# Patient Record
Sex: Female | Born: 1980 | Race: White | Hispanic: No | Marital: Married | Smoking: Current every day smoker
Health system: Southern US, Community
[De-identification: ages and names within clinical notes are randomized; demographics above are authoritative.]

## PROBLEM LIST (undated history)

## (undated) DIAGNOSIS — F17200 Nicotine dependence, unspecified, uncomplicated: Secondary | ICD-10-CM

## (undated) DIAGNOSIS — Z803 Family history of malignant neoplasm of breast: Secondary | ICD-10-CM

## (undated) DIAGNOSIS — Z8619 Personal history of other infectious and parasitic diseases: Secondary | ICD-10-CM

## (undated) DIAGNOSIS — N879 Dysplasia of cervix uteri, unspecified: Secondary | ICD-10-CM

## (undated) DIAGNOSIS — Z8669 Personal history of other diseases of the nervous system and sense organs: Secondary | ICD-10-CM

## (undated) HISTORY — DX: Nicotine dependence, unspecified, uncomplicated: F17.200

## (undated) HISTORY — DX: Family history of malignant neoplasm of breast: Z80.3

## (undated) HISTORY — DX: Personal history of other diseases of the nervous system and sense organs: Z86.69

## (undated) HISTORY — DX: Personal history of other infectious and parasitic diseases: Z86.19

## (undated) HISTORY — DX: Dysplasia of cervix uteri, unspecified: N87.9

---
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
## 2011-05-13 ENCOUNTER — Ambulatory Visit (INDEPENDENT_AMBULATORY_CARE_PROVIDER_SITE_OTHER): Payer: BC Managed Care – PPO | Admitting: Family Medicine

## 2011-05-13 ENCOUNTER — Encounter: Payer: Self-pay | Admitting: Family Medicine

## 2011-05-13 VITALS — BP 118/80 | HR 88 | Temp 98.0°F | Ht 65.0 in | Wt 133.8 lb

## 2011-05-13 DIAGNOSIS — Z0000 Encounter for general adult medical examination without abnormal findings: Secondary | ICD-10-CM

## 2011-05-13 DIAGNOSIS — R61 Generalized hyperhidrosis: Secondary | ICD-10-CM

## 2011-05-13 DIAGNOSIS — Z23 Encounter for immunization: Secondary | ICD-10-CM

## 2011-05-13 DIAGNOSIS — F17200 Nicotine dependence, unspecified, uncomplicated: Secondary | ICD-10-CM

## 2011-05-13 LAB — LIPID PANEL
LDL Cholesterol: 59 mg/dL (ref 0–99)
Total CHOL/HDL Ratio: 2

## 2011-05-13 LAB — CBC WITH DIFFERENTIAL/PLATELET
Basophils Relative: 0.6 % (ref 0.0–3.0)
Eosinophils Absolute: 0.1 10*3/uL (ref 0.0–0.7)
MCHC: 34 g/dL (ref 30.0–36.0)
MCV: 97.1 fl (ref 78.0–100.0)
Monocytes Absolute: 0.7 10*3/uL (ref 0.1–1.0)
Neutrophils Relative %: 63.7 % (ref 43.0–77.0)
RBC: 4.54 Mil/uL (ref 3.87–5.11)

## 2011-05-13 LAB — COMPREHENSIVE METABOLIC PANEL
ALT: 20 U/L (ref 0–35)
AST: 19 U/L (ref 0–37)
Albumin: 4 g/dL (ref 3.5–5.2)
Calcium: 8.9 mg/dL (ref 8.4–10.5)
Chloride: 107 mEq/L (ref 96–112)
Creatinine, Ser: 1 mg/dL (ref 0.4–1.2)
Potassium: 4.2 mEq/L (ref 3.5–5.1)
Sodium: 140 mEq/L (ref 135–145)

## 2011-05-13 NOTE — Patient Instructions (Signed)
Blood work today. Tdap today (tetanus) Look into [email or web address] for smoking cessation resources, return if you would like to further discuss. Remember, more fruits/vegetables daily. Call us with questions.  Good to meet you today. Return as needed or in another 2-3 years for physical.

## 2011-05-13 NOTE — Assessment & Plan Note (Signed)
Reviewed preventative protocols and updated unless pt declined. Flu declined. Tdap today. Discussed 100% seatbelt use, sunscreen use. Blood work today, fasting.

## 2011-05-13 NOTE — Assessment & Plan Note (Addendum)
blood work today (TSH, CBC). Consider PPD, HIV. Return if weight changes or sxs continued, worsening. overall healthy today.

## 2011-05-13 NOTE — Assessment & Plan Note (Signed)
Encouraged cessation, provided with resources.

## 2011-05-13 NOTE — Progress Notes (Signed)
Subjective:    Patient ID: Amber Donaldson, female    DOB: Nov 06, 1980, 30 y.o.   MRN: [id number]  HPI CC: new pt  No concerns today.  would like CPE today.  Smoking - <1/2 ppd, smoking since age 33yo.  Has quit x 1 while pregnant.  Has not tried anything to help quit in past.  Contemplative.  Some night sweats occuring a few times a week, has to change shirt.  No fevers, no weight changes.  Last 7 mo, hair growing more slowly.  No skin changes.  No bm changes, diarrhea, constipation.  Some heat intolerance.  Some fatigue.  Sleeping 7 hours/night.  Preventative: [facility] - [facility] in [place], last 10/2010.  h/o abnl paps, none since 30yo.  LMP 04/26/2011. No recent blood work Tetanus - unsure.  Tdap today. Flu shot - declines today. Fasting today. Seat belt 100% time, sunscreen use.  Caffeine: 1-2 sodas/day Lives with husband, 1 daughter (2010), no pets Occupation: stay at home mom Edu: Some college Activity: home exercise regimen 15 min/day Diet: some fruits/vegetables, good water, red meat QOweek, rare fish  Medications and allergies reviewed and updated in chart.  Past histories reviewed and updated if relevant as below. Patient Active Problem List  Diagnoses  . Smoker  . Healthcare maintenance   Past Medical History  Diagnosis Date  . History of chicken pox   . History of migraines     1/month, excedrin controls   Past Surgical History  Procedure Date  . Induced abortion 2001   History  Substance Use Topics  . Smoking status: Current Everyday Smoker -- 0.3 packs/day    Types: Cigarettes  . Smokeless tobacco: Never Used  . Alcohol Use: Yes     occasionally   Family History  Problem Relation Age of Onset  . Cancer Mother 65  . Heart disease Paternal Uncle   . Heart disease Paternal Grandfather   . Stroke Neg Hx   . Diabetes Cousin    No Known Allergies No current outpatient prescriptions on file prior to visit.    Review of Systems    Constitutional: Negative for fever, chills, activity change, appetite change, fatigue and unexpected weight change.  HENT: Negative for hearing loss and neck pain.   Eyes: Negative for visual disturbance.  Respiratory: Negative for cough, chest tightness, shortness of breath and wheezing.   Cardiovascular: Negative for chest pain, palpitations and leg swelling.  Gastrointestinal: Negative for nausea, vomiting, abdominal pain, diarrhea, constipation, blood in stool and abdominal distention.  Genitourinary: Negative for hematuria and difficulty urinating.  Musculoskeletal: Negative for myalgias and arthralgias.  Skin: Negative for rash.  Neurological: Positive for headaches. Negative for dizziness, seizures and syncope.  Hematological: Does not bruise/bleed easily.  Psychiatric/Behavioral: Negative for dysphoric mood. The patient is not nervous/anxious.   occasional night sweats    Objective:   Physical Exam  Nursing note and vitals reviewed. Constitutional: She is oriented to person, place, and time. She appears well-developed and well-nourished. No distress.  HENT:  Head: Normocephalic and atraumatic.  Right Ear: External ear normal.  Left Ear: External ear normal.  Nose: Nose normal.  Mouth/Throat: Oropharynx is clear and moist. No oropharyngeal exudate.  Eyes: Conjunctivae and EOM are normal. Pupils are equal, round, and reactive to light. No scleral icterus.  Neck: Normal range of motion. Neck supple. No thyromegaly present.  Cardiovascular: Normal rate, regular rhythm, normal heart sounds and intact distal pulses.   No murmur heard. Pulses:  Radial pulses are 2+ on the right side, and 2+ on the left side.  Pulmonary/Chest: Effort normal and breath sounds normal. No respiratory distress. She has no wheezes. She has no rales.  Abdominal: Soft. Bowel sounds are normal. She exhibits no distension and no mass. There is no tenderness. There is no rebound and no guarding.   Musculoskeletal: Normal range of motion.  Lymphadenopathy:    She has no cervical adenopathy.  Neurological: She is alert and oriented to person, place, and time.       CN grossly intact, station and gait intact  Skin: Skin is warm and dry. No rash noted.  Psychiatric: She has a normal mood and affect. Her behavior is normal. Judgment and thought content normal.        Assessment & Plan:

## 2014-06-09 NOTE — L&D Delivery Note (Signed)
Delivery Note At 7:36 PM a viable female was delivered via Vaginal, Spontaneous Delivery (Presentation: ; Occiput Anterior).  APGAR: 9, 9; weight 6#12oz .   Placenta status: Intact, Spontaneous.  Cord: 3 vessels with the following complications: double true knot, nuchal x 1, reduced easily.  Cord pH: N/A  Anesthesia: Epidural  Episiotomy: None Lacerations: 1st degree Suture Repair: 2.0 vicryl Est. Blood Loss (mL): 400  Approx 45 min second stage. Infant to maternal abdomen, cord clamped and cut by FOB. Cord blood specimen collected for donation. Infant to skin-to-skin with mother.   Baby's Name: Amber Donaldson to postpartum.  Baby to Couplet care / Skin to Skin.  Marta Antu 01/13/2015, 8:13 PM

## 2014-07-31 ENCOUNTER — Encounter: Payer: Self-pay | Admitting: Obstetrics & Gynecology

## 2014-11-02 LAB — OB RESULTS CONSOLE GC/CHLAMYDIA
Chlamydia: NEGATIVE
Gonorrhea: NEGATIVE

## 2014-11-02 LAB — OB RESULTS CONSOLE ANTIBODY SCREEN: ANTIBODY SCREEN: NEGATIVE

## 2014-11-02 LAB — OB RESULTS CONSOLE HEPATITIS B SURFACE ANTIGEN: HEP B S AG: NEGATIVE

## 2014-11-02 LAB — OB RESULTS CONSOLE ABO/RH: RH Type: POSITIVE

## 2014-11-02 LAB — OB RESULTS CONSOLE RPR: RPR: NONREACTIVE

## 2014-11-02 LAB — OB RESULTS CONSOLE HIV ANTIBODY (ROUTINE TESTING): HIV: NONREACTIVE

## 2014-11-02 LAB — OB RESULTS CONSOLE RUBELLA ANTIBODY, IGM: RUBELLA: IMMUNE

## 2014-11-02 LAB — OB RESULTS CONSOLE VARICELLA ZOSTER ANTIBODY, IGG: VARICELLA IGG: IMMUNE

## 2015-01-03 LAB — OB RESULTS CONSOLE GBS: GBS: NEGATIVE

## 2015-01-13 ENCOUNTER — Inpatient Hospital Stay: Payer: BLUE CROSS/BLUE SHIELD | Admitting: Anesthesiology

## 2015-01-13 ENCOUNTER — Inpatient Hospital Stay
Admission: EM | Admit: 2015-01-13 | Discharge: 2015-01-14 | DRG: 775 | Disposition: A | Discharge status: Home / Self Care | Payer: BLUE CROSS/BLUE SHIELD | Attending: Obstetrics & Gynecology | Admitting: Obstetrics & Gynecology

## 2015-01-13 DIAGNOSIS — L723 Sebaceous cyst: Secondary | ICD-10-CM | POA: Diagnosis present

## 2015-01-13 DIAGNOSIS — Z3A38 38 weeks gestation of pregnancy: Secondary | ICD-10-CM | POA: Diagnosis present

## 2015-01-13 DIAGNOSIS — Z833 Family history of diabetes mellitus: Secondary | ICD-10-CM | POA: Diagnosis not present

## 2015-01-13 DIAGNOSIS — Z87891 Personal history of nicotine dependence: Secondary | ICD-10-CM

## 2015-01-13 DIAGNOSIS — O4290 Premature rupture of membranes, unspecified as to length of time between rupture and onset of labor, unspecified weeks of gestation: Secondary | ICD-10-CM | POA: Diagnosis present

## 2015-01-13 DIAGNOSIS — Z809 Family history of malignant neoplasm, unspecified: Secondary | ICD-10-CM | POA: Diagnosis not present

## 2015-01-13 DIAGNOSIS — Z8249 Family history of ischemic heart disease and other diseases of the circulatory system: Secondary | ICD-10-CM | POA: Diagnosis not present

## 2015-01-13 LAB — CBC
HCT: 39.6 % (ref 35.0–47.0)
Hemoglobin: 13.7 g/dL (ref 12.0–16.0)
MCH: 32.2 pg (ref 26.0–34.0)
MCHC: 34.6 g/dL (ref 32.0–36.0)
MCV: 93.1 fL (ref 80.0–100.0)
Platelets: 184 10*3/uL (ref 150–440)
RBC: 4.26 MIL/uL (ref 3.80–5.20)
RDW: 12.5 % (ref 11.5–14.5)
WBC: 8 10*3/uL (ref 3.6–11.0)

## 2015-01-13 LAB — SAMPLE TO BLOOD BANK

## 2015-01-13 MED ORDER — ACETAMINOPHEN 325 MG PO TABS: 650.0000 mg | ORAL_TABLET | ORAL | Status: DC | PRN

## 2015-01-13 MED ORDER — OXYCODONE-ACETAMINOPHEN 5-325 MG PO TABS: 1.0000 | ORAL_TABLET | ORAL | Status: DC | PRN

## 2015-01-13 MED ORDER — LIDOCAINE-EPINEPHRINE (PF) 1.5 %-1:200000 IJ SOLN
INTRAMUSCULAR | Status: DC | PRN
  Administered 2015-01-13: 4 mL via EPIDURAL

## 2015-01-13 MED ORDER — EPHEDRINE 5 MG/ML INJ
10.0000 mg | INTRAVENOUS | Status: DC | PRN
  Filled 2015-01-13: qty 2

## 2015-01-13 MED ORDER — ONDANSETRON HCL 4 MG PO TABS: 4.0000 mg | ORAL_TABLET | ORAL | Status: DC | PRN

## 2015-01-13 MED ORDER — AMMONIA AROMATIC IN INHA
RESPIRATORY_TRACT | Status: DC
Start: 2015-01-13 — End: 2015-01-14
  Filled 2015-01-13: qty 10

## 2015-01-13 MED ORDER — DIBUCAINE 1 % RE OINT: 1.0000 "application " | TOPICAL_OINTMENT | RECTAL | Status: DC | PRN

## 2015-01-13 MED ORDER — ZOLPIDEM TARTRATE 5 MG PO TABS: 5.0000 mg | ORAL_TABLET | Freq: Every evening | ORAL | Status: DC | PRN

## 2015-01-13 MED ORDER — OXYTOCIN 40 UNITS IN LACTATED RINGERS INFUSION - SIMPLE MED
INTRAVENOUS | Status: AC
Start: 2015-01-13 — End: 2015-01-13
  Filled 2015-01-13: qty 1000

## 2015-01-13 MED ORDER — FERROUS SULFATE 325 (65 FE) MG PO TABS
325.0000 mg | ORAL_TABLET | Freq: Every day | ORAL | Status: DC
  Administered 2015-01-14: 325 mg via ORAL
  Filled 2015-01-13: qty 1

## 2015-01-13 MED ORDER — FENTANYL 2.5 MCG/ML W/ROPIVACAINE 0.2% IN NS 100 ML EPIDURAL INFUSION (ARMC-ANES)
9.0000 mL/h | EPIDURAL | Status: DC
  Administered 2015-01-13: 9 mL/h via EPIDURAL

## 2015-01-13 MED ORDER — IBUPROFEN 600 MG PO TABS
600.0000 mg | ORAL_TABLET | Freq: Four times a day (QID) | ORAL | Status: DC
  Administered 2015-01-13 – 2015-01-14 (×2): 600 mg via ORAL
  Filled 2015-01-13 (×2): qty 1

## 2015-01-13 MED ORDER — BENZOCAINE-MENTHOL 20-0.5 % EX AERO
1.0000 | INHALATION_SPRAY | CUTANEOUS | Status: DC | PRN
Start: 2015-01-13 — End: 2015-01-15

## 2015-01-13 MED ORDER — BUTORPHANOL TARTRATE 1 MG/ML IJ SOLN
2.0000 mg | Freq: Once | INTRAMUSCULAR | Status: AC | PRN
  Administered 2015-01-13: 2 mg via INTRAVENOUS
  Filled 2015-01-13: qty 2

## 2015-01-13 MED ORDER — LIDOCAINE HCL (PF) 1 % IJ SOLN
INTRAMUSCULAR | Status: AC
  Administered 2015-01-13: 2 mL
  Administered 2015-01-13: 1 mL
  Filled 2015-01-13: qty 30

## 2015-01-13 MED ORDER — WITCH HAZEL-GLYCERIN EX PADS: 1.0000 "application " | MEDICATED_PAD | CUTANEOUS | Status: DC | PRN

## 2015-01-13 MED ORDER — LACTATED RINGERS IV SOLN: INTRAVENOUS | Status: DC

## 2015-01-13 MED ORDER — SIMETHICONE 80 MG PO CHEW: 80.0000 mg | CHEWABLE_TABLET | ORAL | Status: DC | PRN

## 2015-01-13 MED ORDER — LANOLIN HYDROUS EX OINT: TOPICAL_OINTMENT | CUTANEOUS | Status: DC | PRN

## 2015-01-13 MED ORDER — PRENATAL MULTIVITAMIN CH
1.0000 | ORAL_TABLET | Freq: Every day | ORAL | Status: DC
  Administered 2015-01-14: 1 via ORAL
  Filled 2015-01-13: qty 1

## 2015-01-13 MED ORDER — DOCUSATE SODIUM 100 MG PO CAPS
100.0000 mg | ORAL_CAPSULE | Freq: Two times a day (BID) | ORAL | Status: DC
  Administered 2015-01-14: 100 mg via ORAL
  Filled 2015-01-13: qty 1

## 2015-01-13 MED ORDER — OXYTOCIN BOLUS FROM INFUSION: 500.0000 mL | INTRAVENOUS | Status: DC

## 2015-01-13 MED ORDER — DIPHENHYDRAMINE HCL 25 MG PO CAPS: 25.0000 mg | ORAL_CAPSULE | Freq: Four times a day (QID) | ORAL | Status: DC | PRN

## 2015-01-13 MED ORDER — ONDANSETRON HCL 4 MG/2ML IJ SOLN: 4.0000 mg | INTRAMUSCULAR | Status: DC | PRN

## 2015-01-13 MED ORDER — OXYTOCIN 10 UNIT/ML IJ SOLN
INTRAMUSCULAR | Status: AC
  Filled 2015-01-13: qty 2

## 2015-01-13 MED ORDER — PHENYLEPHRINE 40 MCG/ML (10ML) SYRINGE FOR IV PUSH (FOR BLOOD PRESSURE SUPPORT)
80.0000 ug | PREFILLED_SYRINGE | INTRAVENOUS | Status: DC | PRN
  Filled 2015-01-13: qty 2

## 2015-01-13 MED ORDER — FENTANYL 2.5 MCG/ML W/ROPIVACAINE 0.2% IN NS 100 ML EPIDURAL INFUSION (ARMC-ANES)
EPIDURAL | Status: AC
  Administered 2015-01-13: 9 mL/h via EPIDURAL
  Filled 2015-01-13: qty 100

## 2015-01-13 MED ORDER — OXYTOCIN 40 UNITS IN LACTATED RINGERS INFUSION - SIMPLE MED
62.5000 mL/h | INTRAVENOUS | Status: DC
  Administered 2015-01-13: 62.5 mL/h via INTRAVENOUS

## 2015-01-13 MED ORDER — BUPIVACAINE HCL (PF) 0.25 % IJ SOLN
INTRAMUSCULAR | Status: DC | PRN
  Administered 2015-01-13: 5 mL

## 2015-01-13 MED ORDER — DIPHENHYDRAMINE HCL 50 MG/ML IJ SOLN: 12.5000 mg | INTRAMUSCULAR | Status: DC | PRN

## 2015-01-13 MED ORDER — LACTATED RINGERS IV SOLN
INTRAVENOUS | Status: DC
  Administered 2015-01-13 (×2): via INTRAVENOUS

## 2015-01-13 MED ORDER — LACTATED RINGERS IV SOLN: 500.0000 mL | INTRAVENOUS | Status: DC | PRN

## 2015-01-13 MED ORDER — FENTANYL 2.5 MCG/ML W/ROPIVACAINE 0.2% IN NS 100 ML EPIDURAL INFUSION (ARMC-ANES): 9.0000 mL/h | EPIDURAL | Status: DC

## 2015-01-13 MED ORDER — MISOPROSTOL 200 MCG PO TABS
ORAL_TABLET | ORAL | Status: DC
Start: 2015-01-13 — End: 2015-01-14
  Filled 2015-01-13: qty 4

## 2015-01-13 NOTE — Anesthesia Procedure Notes (Signed)
Epidural Patient location during procedure: OB  Staffing Performed by: anesthesiologist   Preanesthetic Checklist Completed: patient identified, site marked, surgical consent, pre-op evaluation, timeout performed, IV checked, risks and benefits discussed and monitors and equipment checked  Epidural Patient position: sitting Prep: Betadine Patient monitoring: heart rate, continuous pulse ox and blood pressure Approach: midline Location: L4-L5 Injection technique: LOR saline  Needle:  Needle type: Tuohy  Needle gauge: 18 G Needle length: 9 cm and 9 Needle insertion depth: 5 cm Catheter type: closed end flexible Catheter size: 20 Guage Catheter at skin depth: 10 cm Test dose: negative and 1.5% lidocaine with Epi 1:200 K  Assessment Sensory level: T10 Events: blood not aspirated, injection not painful, no injection resistance, negative IV test and no paresthesia  Additional Notes   Patient tolerated the insertion well without complications.-SATD -IVTD. No paresthesia. Refer to [facility] nursing for VS and dosing. Initial placemnt at L4L5 but significant scoliosis with transient Left paresthesia resolving spont. Changed to L3L4 and easy pass of catheter 5 cm into space, uneventful and well tolerated.Reason for block:procedure for pain

## 2015-01-13 NOTE — Anesthesia Preprocedure Evaluation (Signed)
Anesthesia Evaluation  Patient identified by MRN, date of birth, ID band Patient awake    Reviewed: Allergy & Precautions, H&P , NPO status , Patient's Chart, lab work & pertinent test results, reviewed documented beta blocker date and time   Airway Mallampati: III  TM Distance: >3 FB Neck ROM: full    Dental no notable dental hx. (+) Teeth Intact   Pulmonary neg pulmonary ROS, former smoker,  breath sounds clear to auscultation  Pulmonary exam normal       Cardiovascular Exercise Tolerance: Good negative cardio ROS Normal cardiovascular examRhythm:regular Rate:Normal     Neuro/Psych negative neurological ROS  negative psych ROS   GI/Hepatic negative GI ROS, Neg liver ROS,   Endo/Other  negative endocrine ROS  Renal/GU negative Renal ROS  negative genitourinary   Musculoskeletal   Abdominal   Peds  Hematology negative hematology ROS (+)   Anesthesia Other Findings   Reproductive/Obstetrics (+) Pregnancy                             Anesthesia Physical Anesthesia Plan  ASA: II and emergent  Anesthesia Plan: Regional and Epidural   Post-op Pain Management:    Induction:   Airway Management Planned:   Additional Equipment:   Intra-op Plan:   Post-operative Plan:   Informed Consent: I have reviewed the patients History and Physical, chart, labs and discussed the procedure including the risks, benefits and alternatives for the proposed anesthesia with the patient or authorized representative who has indicated his/her understanding and acceptance.     Plan Discussed with: CRNA  Anesthesia Plan Comments:         Anesthesia Quick Evaluation

## 2015-01-13 NOTE — H&P (Signed)
Obstetric History and Physical  Amber Donaldson is a 34 y.o. G3P1011 with Estimated Date of Delivery: 01/24/15 per LMP and 10 wk [place] who presents at [redacted]w[redacted]d  presenting for LOF at 0700 this am. Pt reports a "constant dripping" followed by a large gush of fluid. Patient states she has been having occasional, mild contractions, no vaginal bleeding, with active fetal movement.    Prenatal Course Source of Care: [facility]  with onset of care at 8 weeks Pregnancy complications or risks: 1st trimester with elevated DS risk, negative Panorama. Otherwise normal pregnancy  She plans to breastfeed She desires oral progesterone-only contraceptive for postpartum contraception.   Prenatal labs and studies: ABO, Rh: A+  Antibody: negative Rubella: Immune Varicella: Immune RPR:  Non-reactive HBsAg:  neg HIV: neg GC/CT: neg/neg GBS: neg 1 hr Glucola: 76   Genetic screening: negative Panorama after 1st trimester screen 1:100 DS risk    Prenatal Transfer Tool   Past Medical History  Diagnosis Date  . History of chicken pox   . History of migraines     1/month, excedrin controls  . Smoker     Past Surgical History  Procedure Laterality Date  . Induced abortion  2001  . No past surgeries      OB History  Gravida Para Term Preterm AB SAB TAB Ectopic Multiple Living  3 1 1  1 1    1     # Outcome Date GA Lbr Len/2nd Weight Sex Delivery Anes PTL Lv  3 Current           2 SAB 11/20/09          1 Term 07/06/08 [redacted]w[redacted]d  7 lb 6 oz (3.345 kg) F Vag-Spont EPI N Y      History   Social History  . Marital Status: Married    Spouse Name: N/A  . Number of Children: N/A  . Years of Education: N/A   Social History Main Topics  . Smoking status: Former Smoker -- 0.30 packs/day    Types: Cigarettes  . Smokeless tobacco: Never Used  . Alcohol Use: No     Comment: occasionally  . Drug Use: No  . Sexual Activity: Yes   Other Topics Concern  . None   Social History Narrative   Caffeine: 1-2  sodas/day   Lives with husband, 1 daughter (2010), no pets   Occupation: stay at home mom   Edu: Some college   Activity: home exercise regimen 15 min/day   Diet: some fruits/vegetables, good water, red meat QOweek, rare fish    Family History  Problem Relation Age of Onset  . Cancer Mother 11  . Heart disease Paternal Uncle   . Heart disease Paternal Grandfather   . Stroke Neg Hx   . Diabetes Cousin     Prescriptions prior to admission  Medication Sig Dispense Refill Last Dose  . Prenatal Vit-Fe Fumarate-FA (PRENATAL MULTIVITAMIN) TABS tablet Take 1 tablet by mouth daily at 12 noon.     . cyanocobalamin 500 MCG tablet Take 500 mcg by mouth daily.     Not Taking at Unknown time  . pyridoxine (B-6) 100 MG tablet Take 100 mg by mouth daily.     Not Taking at Unknown time    No Known Allergies  Review of Systems: Negative except for what is mentioned in HPI.  Physical Exam: BP 120/74 mmHg  Pulse 64  Temp(Src) 97.9 F (36.6 C) (Oral)  Resp 16  Ht 5\' 5"  (1.651 m)  Wt 183 lb (83.008 kg)  BMI 30.45 kg/m2  LMP 04/19/2014 GENERAL: Well-developed, well-nourished female in no acute distress.  LUNGS: Clear to auscultation bilaterally.  HEART: Regular rate and rhythm. ABDOMEN: Soft, nontender, nondistended, gravid. EXTREMITIES: Nontender, no edema Cervical Exam: Dilatation 3 cm   Effacement 70 %   Station -1, SSE: + pooling, + nitrizine, no ferning noted Presentation: cephalic FHT: Category: 1 Baseline rate 140 bpm   Variability moderate  Accelerations present   Decelerations none Contractions: Every rare mins   Pertinent Labs/Studies:   No results found for this or any previous visit (from the past 24 hour(s)).  Assessment : IUP at [redacted]w[redacted]d, SROM  Plan: Admit for SROM/labor  Monitor for cervical change and contractions - ambulation for now, consider Pitocin augmentation prn. IV stadol or epidural when indicated.   Planning cord blood banking to public bank - will fill out  paper work and collect specimens indicated. Reviewed will not likely be able to do delayed cord clamping.

## 2015-01-13 NOTE — Progress Notes (Signed)
Pt transferred to [facility] room 340. [employer] accompanying pt to room. [employer] notified of pick up, as directed by [employer]. Courier representative Shawn also aware of pt transfer to room 340, [employer] contact number provided and states the company will pick up [employer] box Mon morning between 7a-8a. Pt and spouse aware.

## 2015-01-13 NOTE — Progress Notes (Signed)
L&D Note  01/13/2015 - 12:59 PM  34 y.o. G3P1011 [redacted]w[redacted]d   Amber Donaldson is admitted for SROM   Subjective:  Pt has been ambulated intermittently for the past few hours,  Reports ctx are more intense now Objective:   Filed Vitals:   01/13/15 0859 01/13/15 0900 01/13/15 0914 01/13/15 1130  BP: 129/91 126/93 120/74   Pulse: 70 60 64   Temp:    98.1 F (36.7 C)  TempSrc:    Oral  Resp:      Height:      Weight:        Current Vital Signs 24h Vital Sign Ranges  T 98.1 F (36.7 C) Temp  Avg: 98 F (36.7 C)  Min: 97.9 F (36.6 C)  Max: 98.1 F (36.7 C)  BP 120/74 mmHg BP  Min: 120/74  Max: 133/82  HR 64 Pulse  Avg: 63  Min: 58  Max: 70  RR 16 Resp  Avg: 16  Min: 16  Max: 16  SaO2   Not Delivered No Data Recorded       24 Hour I/O Current Shift I/O  Time Ins Outs        FHR: category 1 Toco: just reapplied, approx q 3 min SVE: 4/80/-2   Assessment :  IUP at [redacted]w[redacted]d, SROM, early labor    Plan:  Continue expectant management for now IV stadol or epidural if desired   Marta Antu, CNM

## 2015-01-14 LAB — HEMOGLOBIN AND HEMATOCRIT, BLOOD
HEMATOCRIT: 39 % (ref 35.0–47.0)
HEMOGLOBIN: 13.6 g/dL (ref 12.0–16.0)

## 2015-01-14 LAB — RPR: RPR Ser Ql: NONREACTIVE

## 2015-01-14 MED ORDER — IBUPROFEN 600 MG PO TABS
600.0000 mg | ORAL_TABLET | Freq: Four times a day (QID) | ORAL | Status: DC
Start: 2015-01-14 — End: 2017-07-20

## 2015-01-14 MED ORDER — WHITE PETROLATUM GEL
Status: AC
  Filled 2015-01-14: qty 10

## 2015-01-14 MED ORDER — NORETHIN ACE-ETH ESTRAD-FE 1-20 MG-MCG PO TABS: 1.0000 | ORAL_TABLET | Freq: Every day | ORAL | Status: DC

## 2015-01-14 NOTE — Progress Notes (Signed)
Discharge instructions reviewed with pt. Pt discharged home with newborn.

## 2015-01-14 NOTE — Discharge Instructions (Signed)
Discharge instructions:  ° °Call office if you have any of the following: headache, visual changes, fever >100 F, chills, breast concerns, excessive vaginal bleeding, incision drainage or problems, leg pain or redness, depression or any other concerns.  ° °Activity: Do not lift > 10 lbs for 6 weeks.  °No intercourse or tampons for 6 weeks.  °No driving for 1-2 weeks.  ° °

## 2015-01-14 NOTE — Discharge Summary (Signed)
Obstetric Discharge Summary Reason for Admission: onset of labor Delivery Type: spontaneous vaginal delivery Postpartum Procedures: none Complications-Intrapartum or Postpartum: none    Recent Labs  01/13/15 1134 01/14/15 0529  HGB 13.7 13.6  HCT 39.6 39.0      Gestational Age at Delivery: [redacted]w[redacted]d  Antepartum complications: none Date of Delivery: 01/13/15  Delivered By: Donnalee Curry, CNM Delivery Type: spontaneous vaginal delivery  Physical Exam:  General: alert and cooperative Lochia: appropriate Uterine Fundus: firm Incision: N/A DVT Evaluation: No evidence of DVT seen on physical exam. Abdomen: abdomen is soft without significant tenderness, masses, organomegaly or guarding  Prenatal Labs Blood Type: a+ Rubella: Immune Varicella: Immune TDAP: Given during pregnancy Contraception: OCP  Discharge Diagnoses: Term Pregnancy-delivered  Discharge Information: Date: 01/14/2015 Activity: pelvic rest Diet: routine Medications: PNV, Ibuprofen and Junel Fe Condition: stable Instructions:  Discharge instructions:   Call office if you have any of the following: headache, visual changes, fever >100 F, chills, breast concerns, excessive vaginal bleeding, incision drainage or problems, leg pain or redness, depression or any other concerns.   Activity: Do not lift > 10 lbs for 6 weeks.  No intercourse or tampons for 6 weeks.  No driving for 1-2 weeks.   Discharge to: home Follow-up Information    Follow up with Marta Antu, CNM. Schedule an appointment as soon as possible for a visit in 6 weeks.   Specialty:  Certified Nurse Midwife   Why:  Postpartum visit   Contact information:   [address] [place] [address] [phone]       Newborn Data: Live born female  Birth Weight: 6 lb 12.8 oz (3085 g) APGAR: 9, 9  Home with mother.  Marta Antu, [place] 01/14/2015, 10:08 AM

## 2015-01-14 NOTE — Anesthesia Postprocedure Evaluation (Signed)
  Anesthesia Post-op Note  Patient: Amber Donaldson  Procedure(s) Performed: * No procedures listed *  Anesthesia type:Regional, Epidural  Patient location: PACU  Post pain: Pain level controlled  Post assessment: Post-op Vital signs reviewed, Patient's Cardiovascular Status Stable, Respiratory Function Stable, Patent Airway and No signs of Nausea or vomiting  Post vital signs: Reviewed and stable  Last Vitals:  Filed Vitals:   01/14/15 0732  BP: 105/74  Pulse: 66  Temp: 36.6 C  Resp: 20    Level of consciousness: awake, alert  and patient cooperative  Complications: No apparent anesthesia complications

## 2015-01-16 LAB — SURGICAL PATHOLOGY

## 2017-07-19 ENCOUNTER — Encounter
Admission: EM | Disposition: A | Discharge status: Home / Self Care | Payer: Self-pay | Source: Home / Self Care | Attending: Emergency Medicine

## 2017-07-19 ENCOUNTER — Emergency Department: Payer: 59

## 2017-07-19 ENCOUNTER — Encounter: Payer: Self-pay | Admitting: Emergency Medicine

## 2017-07-19 ENCOUNTER — Emergency Department: Payer: 59 | Admitting: Anesthesiology

## 2017-07-19 ENCOUNTER — Observation Stay
Admission: EM | Admit: 2017-07-19 | Discharge: 2017-07-20 | Disposition: A | Discharge status: Home / Self Care | Payer: 59 | Attending: Surgery | Admitting: Surgery

## 2017-07-19 ENCOUNTER — Other Ambulatory Visit: Payer: Self-pay

## 2017-07-19 DIAGNOSIS — M546 Pain in thoracic spine: Secondary | ICD-10-CM

## 2017-07-19 DIAGNOSIS — K810 Acute cholecystitis: Secondary | ICD-10-CM

## 2017-07-19 DIAGNOSIS — F17210 Nicotine dependence, cigarettes, uncomplicated: Secondary | ICD-10-CM | POA: Diagnosis not present

## 2017-07-19 DIAGNOSIS — K8012 Calculus of gallbladder with acute and chronic cholecystitis without obstruction: Principal | ICD-10-CM | POA: Insufficient documentation

## 2017-07-19 DIAGNOSIS — R079 Chest pain, unspecified: Secondary | ICD-10-CM | POA: Diagnosis present

## 2017-07-19 DIAGNOSIS — K8000 Calculus of gallbladder with acute cholecystitis without obstruction: Secondary | ICD-10-CM | POA: Diagnosis not present

## 2017-07-19 DIAGNOSIS — K819 Cholecystitis, unspecified: Secondary | ICD-10-CM

## 2017-07-19 HISTORY — PX: CHOLECYSTECTOMY: SHX55

## 2017-07-19 LAB — CBC
HCT: 46.1 % (ref 35.0–47.0)
Hemoglobin: 15.5 g/dL (ref 12.0–16.0)
MCH: 31.6 pg (ref 26.0–34.0)
MCHC: 33.8 g/dL (ref 32.0–36.0)
MCV: 93.5 fL (ref 80.0–100.0)
Platelets: 246 10*3/uL (ref 150–440)
RBC: 4.92 MIL/uL (ref 3.80–5.20)
RDW: 13.1 % (ref 11.5–14.5)
WBC: 7.9 10*3/uL (ref 3.6–11.0)

## 2017-07-19 LAB — BASIC METABOLIC PANEL
Anion gap: 10 (ref 5–15)
BUN: 17 mg/dL (ref 6–20)
CHLORIDE: 104 mmol/L (ref 101–111)
CO2: 24 mmol/L (ref 22–32)
Calcium: 9.5 mg/dL (ref 8.9–10.3)
Creatinine, Ser: 1.09 mg/dL — ABNORMAL HIGH (ref 0.44–1.00)
GFR calc non Af Amer: >60 mL/min (ref >60)
GLUCOSE: 100 mg/dL — AB (ref 65–99)
Potassium: 3.7 mmol/L (ref 3.5–5.1)
Sodium: 138 mmol/L (ref 135–145)

## 2017-07-19 LAB — HEPATIC FUNCTION PANEL
ALT: 25 U/L (ref 14–54)
AST: 16 U/L (ref 15–41)
Albumin: 3.8 g/dL (ref 3.5–5.0)
Alkaline Phosphatase: 34 U/L — ABNORMAL LOW (ref 38–126)
BILIRUBIN TOTAL: 1.4 mg/dL — AB (ref 0.3–1.2)
Bilirubin, Direct: 0.2 mg/dL (ref 0.1–0.5)
Indirect Bilirubin: 1.2 mg/dL — ABNORMAL HIGH (ref 0.3–0.9)
TOTAL PROTEIN: 6.6 g/dL (ref 6.5–8.1)

## 2017-07-19 LAB — TROPONIN I
Troponin I: <0.03 ng/mL (ref <0.03)
Troponin I: <0.03 ng/mL (ref <0.03)

## 2017-07-19 LAB — LIPASE, BLOOD: Lipase: 29 U/L (ref 11–51)

## 2017-07-19 SURGERY — LAPAROSCOPIC CHOLECYSTECTOMY WITH INTRAOPERATIVE CHOLANGIOGRAM
Anesthesia: General

## 2017-07-19 MED ORDER — ACETAMINOPHEN 10 MG/ML IV SOLN
INTRAVENOUS | Status: AC
  Filled 2017-07-19: qty 100

## 2017-07-19 MED ORDER — ONDANSETRON HCL 4 MG/2ML IJ SOLN
4.0000 mg | Freq: Once | INTRAMUSCULAR | Status: AC
  Administered 2017-07-19: 4 mg via INTRAVENOUS
  Filled 2017-07-19: qty 2

## 2017-07-19 MED ORDER — PROPOFOL 10 MG/ML IV BOLUS
INTRAVENOUS | Status: AC
  Filled 2017-07-19: qty 20

## 2017-07-19 MED ORDER — DEXTROSE 5 % IV SOLN
1.0000 g | INTRAVENOUS | Status: DC
  Administered 2017-07-19: 1 g via INTRAVENOUS
  Filled 2017-07-19: qty 10

## 2017-07-19 MED ORDER — SODIUM CHLORIDE 0.9 % IV BOLUS (SEPSIS)
500.0000 mL | Freq: Once | INTRAVENOUS | Status: AC
  Administered 2017-07-19: 500 mL via INTRAVENOUS

## 2017-07-19 MED ORDER — DEXTROSE 5 % IV SOLN
2.0000 g | INTRAVENOUS | Status: AC
  Administered 2017-07-19: 2 g via INTRAVENOUS
  Filled 2017-07-19: qty 20

## 2017-07-19 MED ORDER — SODIUM CHLORIDE 0.9 % IV BOLUS (SEPSIS)
1000.0000 mL | Freq: Once | INTRAVENOUS | Status: AC
  Administered 2017-07-19: 1000 mL via INTRAVENOUS

## 2017-07-19 MED ORDER — ONDANSETRON HCL 4 MG/2ML IJ SOLN: 4.0000 mg | Freq: Four times a day (QID) | INTRAMUSCULAR | Status: DC | PRN

## 2017-07-19 MED ORDER — OXYCODONE-ACETAMINOPHEN 5-325 MG PO TABS
1.0000 | ORAL_TABLET | ORAL | Status: DC | PRN
  Administered 2017-07-19 – 2017-07-20 (×2): 2 via ORAL
  Filled 2017-07-19 (×2): qty 2

## 2017-07-19 MED ORDER — ENOXAPARIN SODIUM 40 MG/0.4ML ~~LOC~~ SOLN
40.0000 mg | SUBCUTANEOUS | Status: DC
  Administered 2017-07-20: 40 mg via SUBCUTANEOUS
  Filled 2017-07-19: qty 0.4

## 2017-07-19 MED ORDER — LACTATED RINGERS IV SOLN
INTRAVENOUS | Status: DC | PRN
  Administered 2017-07-19: 15:00:00 via INTRAVENOUS

## 2017-07-19 MED ORDER — ONDANSETRON HCL 4 MG/2ML IJ SOLN
INTRAMUSCULAR | Status: AC
  Filled 2017-07-19: qty 2

## 2017-07-19 MED ORDER — BUPIVACAINE HCL (PF) 0.5 % IJ SOLN
INTRAMUSCULAR | Status: AC
  Filled 2017-07-19: qty 30

## 2017-07-19 MED ORDER — ACETAMINOPHEN 650 MG RE SUPP
650.0000 mg | Freq: Four times a day (QID) | RECTAL | Status: DC | PRN
  Filled 2017-07-19: qty 1

## 2017-07-19 MED ORDER — FENTANYL CITRATE (PF) 100 MCG/2ML IJ SOLN
25.0000 ug | INTRAMUSCULAR | Status: DC | PRN
Start: 2017-07-19 — End: 2017-07-19
  Administered 2017-07-19 (×2): 50 ug via INTRAVENOUS

## 2017-07-19 MED ORDER — MEPERIDINE HCL 50 MG/ML IJ SOLN
6.2500 mg | INTRAMUSCULAR | Status: DC | PRN
Start: 2017-07-19 — End: 2017-07-19

## 2017-07-19 MED ORDER — IOPAMIDOL (ISOVUE-370) INJECTION 76%
75.0000 mL | Freq: Once | INTRAVENOUS | Status: AC | PRN
  Administered 2017-07-19: 75 mL via INTRAVENOUS

## 2017-07-19 MED ORDER — MIDAZOLAM HCL 2 MG/2ML IJ SOLN
INTRAMUSCULAR | Status: DC | PRN
  Administered 2017-07-19: 2 mg via INTRAVENOUS

## 2017-07-19 MED ORDER — ROCURONIUM BROMIDE 100 MG/10ML IV SOLN
INTRAVENOUS | Status: DC | PRN
  Administered 2017-07-19: 35 mg via INTRAVENOUS
  Administered 2017-07-19: 15 mg via INTRAVENOUS

## 2017-07-19 MED ORDER — LIDOCAINE HCL (CARDIAC) 20 MG/ML IV SOLN
INTRAVENOUS | Status: DC | PRN
  Administered 2017-07-19: 100 mg via INTRAVENOUS

## 2017-07-19 MED ORDER — ONDANSETRON HCL 4 MG/2ML IJ SOLN
INTRAMUSCULAR | Status: DC | PRN
  Administered 2017-07-19: 4 mg via INTRAVENOUS

## 2017-07-19 MED ORDER — LIDOCAINE HCL (PF) 1 % IJ SOLN
INTRAMUSCULAR | Status: AC
  Filled 2017-07-19: qty 30

## 2017-07-19 MED ORDER — OXYCODONE HCL 5 MG PO TABS: 5.0000 mg | ORAL_TABLET | Freq: Once | ORAL | Status: DC | PRN

## 2017-07-19 MED ORDER — KETOROLAC TROMETHAMINE 30 MG/ML IJ SOLN
30.0000 mg | Freq: Four times a day (QID) | INTRAMUSCULAR | Status: DC
  Administered 2017-07-19 – 2017-07-20 (×3): 30 mg via INTRAVENOUS
  Filled 2017-07-19 (×3): qty 1

## 2017-07-19 MED ORDER — FENTANYL CITRATE (PF) 100 MCG/2ML IJ SOLN
INTRAMUSCULAR | Status: AC
  Filled 2017-07-19: qty 2

## 2017-07-19 MED ORDER — KCL IN DEXTROSE-NACL 20-5-0.45 MEQ/L-%-% IV SOLN
INTRAVENOUS | Status: DC
  Administered 2017-07-19 – 2017-07-20 (×2): via INTRAVENOUS
  Filled 2017-07-19 (×4): qty 1000

## 2017-07-19 MED ORDER — PROMETHAZINE HCL 25 MG/ML IJ SOLN: 6.2500 mg | INTRAMUSCULAR | Status: DC | PRN

## 2017-07-19 MED ORDER — SEVOFLURANE IN SOLN
RESPIRATORY_TRACT | Status: AC
  Filled 2017-07-19: qty 250

## 2017-07-19 MED ORDER — ACETAMINOPHEN 325 MG PO TABS: 650.0000 mg | ORAL_TABLET | Freq: Four times a day (QID) | ORAL | Status: DC | PRN

## 2017-07-19 MED ORDER — DEXAMETHASONE SODIUM PHOSPHATE 10 MG/ML IJ SOLN
INTRAMUSCULAR | Status: DC | PRN
  Administered 2017-07-19: 10 mg via INTRAVENOUS

## 2017-07-19 MED ORDER — SODIUM CHLORIDE FLUSH 0.9 % IV SOLN
INTRAVENOUS | Status: AC
  Filled 2017-07-19: qty 10

## 2017-07-19 MED ORDER — MIDAZOLAM HCL 2 MG/2ML IJ SOLN
INTRAMUSCULAR | Status: AC
  Filled 2017-07-19: qty 2

## 2017-07-19 MED ORDER — ACETAMINOPHEN 10 MG/ML IV SOLN
INTRAVENOUS | Status: DC | PRN
  Administered 2017-07-19: 1000 mg via INTRAVENOUS

## 2017-07-19 MED ORDER — KETOROLAC TROMETHAMINE 30 MG/ML IJ SOLN
INTRAMUSCULAR | Status: DC | PRN
  Administered 2017-07-19: 30 mg via INTRAVENOUS

## 2017-07-19 MED ORDER — OXYCODONE HCL 5 MG/5ML PO SOLN: 5.0000 mg | Freq: Once | ORAL | Status: DC | PRN

## 2017-07-19 MED ORDER — KETOROLAC TROMETHAMINE 30 MG/ML IJ SOLN
10.0000 mg | Freq: Once | INTRAMUSCULAR | Status: AC
  Administered 2017-07-19: 9.9 mg via INTRAVENOUS
  Filled 2017-07-19: qty 1

## 2017-07-19 MED ORDER — DEXAMETHASONE SODIUM PHOSPHATE 10 MG/ML IJ SOLN
INTRAMUSCULAR | Status: AC
  Filled 2017-07-19: qty 1

## 2017-07-19 MED ORDER — PROPOFOL 10 MG/ML IV BOLUS
INTRAVENOUS | Status: DC | PRN
  Administered 2017-07-19: 160 mg via INTRAVENOUS
  Administered 2017-07-19: 30 mg via INTRAVENOUS

## 2017-07-19 MED ORDER — SUGAMMADEX SODIUM 200 MG/2ML IV SOLN
INTRAVENOUS | Status: DC | PRN
  Administered 2017-07-19: 150 mg via INTRAVENOUS

## 2017-07-19 MED ORDER — LIDOCAINE HCL 1 % IJ SOLN
INTRAMUSCULAR | Status: DC | PRN
  Administered 2017-07-19: 20 mL

## 2017-07-19 MED ORDER — ONDANSETRON 4 MG PO TBDP: 4.0000 mg | ORAL_TABLET | Freq: Four times a day (QID) | ORAL | Status: DC | PRN

## 2017-07-19 MED ORDER — FENTANYL CITRATE (PF) 100 MCG/2ML IJ SOLN
INTRAMUSCULAR | Status: DC | PRN
  Administered 2017-07-19: 100 ug via INTRAVENOUS
  Administered 2017-07-19: 50 ug via INTRAVENOUS

## 2017-07-19 MED ORDER — MORPHINE SULFATE (PF) 2 MG/ML IV SOLN: 2.0000 mg | INTRAVENOUS | Status: DC | PRN

## 2017-07-19 SURGICAL SUPPLY — 36 items
APPLIER CLIP ROT 10 11.4 M/L (STAPLE) ×2
CATH REDDICK CHOLANGI 4FR 50CM (CATHETERS) ×2 IMPLANT
CHLORAPREP W/TINT 26ML (MISCELLANEOUS) ×2 IMPLANT
CLIP APPLIE ROT 10 11.4 M/L (STAPLE) ×1 IMPLANT
DECANTER SPIKE VIAL GLASS SM (MISCELLANEOUS) IMPLANT
DERMABOND ADVANCED (GAUZE/BANDAGES/DRESSINGS) ×1
DERMABOND ADVANCED .7 DNX12 (GAUZE/BANDAGES/DRESSINGS) ×1 IMPLANT
DRESSING SURGICEL FIBRLLR 1X2 (HEMOSTASIS) IMPLANT
DRSG SURGICEL FIBRILLAR 1X2 (HEMOSTASIS)
ELECT REM PT RETURN 9FT ADLT (ELECTROSURGICAL) ×2
ELECTRODE REM PT RTRN 9FT ADLT (ELECTROSURGICAL) ×1 IMPLANT
GLOVE BIO SURGEON STRL SZ7 (GLOVE) ×6 IMPLANT
GLOVE BIOGEL PI IND STRL 7.5 (GLOVE) ×1 IMPLANT
GLOVE BIOGEL PI INDICATOR 7.5 (GLOVE) ×1
GOWN STRL REUS W/ TWL LRG LVL3 (GOWN DISPOSABLE) ×2 IMPLANT
GOWN STRL REUS W/TWL LRG LVL3 (GOWN DISPOSABLE) ×2
GRASPER SUT TROCAR 14GX15 (MISCELLANEOUS) ×2 IMPLANT
IRRIGATION STRYKERFLOW (MISCELLANEOUS) IMPLANT
IRRIGATOR STRYKERFLOW (MISCELLANEOUS)
IV NS 1000ML (IV SOLUTION)
IV NS 1000ML BAXH (IV SOLUTION) IMPLANT
KIT TURNOVER KIT A (KITS) ×2 IMPLANT
NEEDLE HYPO 22GX1.5 SAFETY (NEEDLE) ×2 IMPLANT
NEEDLE INSUFFLATION 14GA 120MM (NEEDLE) ×2 IMPLANT
NS IRRIG 1000ML POUR BTL (IV SOLUTION) ×2 IMPLANT
PACK LAP CHOLECYSTECTOMY (MISCELLANEOUS) ×2 IMPLANT
POUCH ENDO CATCH 10MM SPEC (MISCELLANEOUS) ×2 IMPLANT
SCISSORS METZENBAUM CVD 33 (INSTRUMENTS) ×2 IMPLANT
SLEEVE ENDOPATH XCEL 5M (ENDOMECHANICALS) ×4 IMPLANT
SUT MNCRL AB 4-0 PS2 18 (SUTURE) ×2 IMPLANT
SUT VICRYL 0 UR6 27IN ABS (SUTURE) ×2 IMPLANT
SUT VICRYL AB 3-0 FS1 BRD 27IN (SUTURE) ×2 IMPLANT
SYR 20CC LL (SYRINGE) ×2 IMPLANT
TROCAR XCEL NON-BLD 11X100MML (ENDOMECHANICALS) ×2 IMPLANT
TROCAR XCEL NON-BLD 5MMX100MML (ENDOMECHANICALS) ×2 IMPLANT
TUBING INSUFFLATION (TUBING) ×2 IMPLANT

## 2017-07-19 NOTE — Anesthesia Post-op Follow-up Note (Signed)
Anesthesia QCDR form completed.        

## 2017-07-19 NOTE — ED Triage Notes (Signed)
Patient reports that around 19:00 last night she started having upper back pain radiating to her chest with vomiting and shortness of breath.

## 2017-07-19 NOTE — Op Note (Signed)
SURGICAL OPERATIVE REPORT   DATE OF PROCEDURE: 07/19/2017  ATTENDING Surgeon(s): Ancil Linseyavis, Garnett Rekowski Evan, MD  ANESTHESIA: GETA  PRE-OPERATIVE DIAGNOSIS: Acute Cholecystitis (K80.00)  POST-OPERATIVE DIAGNOSIS: Acute Cholecystitis (K80.00)  PROCEDURE(S): (cpt's: 47563) 1.) Laparoscopic Cholecystectomy 2.) Intra-operative Cholangiogram  INTRAOPERATIVE FINDINGS: Moderately severe inflammation of patient's gallbladder and pericholecystic inflammation with severe edema along gallbladder fossa of the liver, patent hepatic ducts, cystic ducts, and common bile duct, though only contrast draining into duodenum visualized on initial images of cholangiogram, requiring additional contrast to be injected and introduction of some gas bubbles into the CBD, most - all of which were flushed through in order to obtain images without concern for filling defect(s)  INTRAOPERATIVE FLUIDS: 700 mL crystalloid   CONTRAST USED: 15 mL  ESTIMATED BLOOD LOSS: Minimal (<30 mL)   URINE OUTPUT: No foley  SPECIMENS: Gallbladder  IMPLANTS: None  DRAINS: None   COMPLICATIONS: None apparent   CONDITION AT COMPLETION: Hemodynamically stable and extubated  DISPOSITION: PACU   INDICATION(S) FOR PROCEDURE:  Patient is a 37 y.o. female who this admission presented with post-prandial RUQ > epigastric abdominal pain after eating fatty foods in particular. 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 All risks, benefits, and alternatives to above elective procedures were discussed with the patient, who elected to proceed, and informed consent was accordingly obtained at that time.   DETAILS OF PROCEDURE:  Patient was brought to the operating suite and appropriately identified. General anesthesia was administered along with peri-operative prophylactic IV antibiotics, and endotracheal  intubation was performed by anesthesiologist, along with NG/OG tube for gastric decompression. In supine position, operative site was prepped and draped in usual sterile fashion, and following a brief time out, initial 5 mm incision was made in a natural skin crease just above and to the Right of patient's umbilicus due to location of her prior umbilical piercing. Fascia was then elevated, and a Verress needle was inserted and its proper position confirmed using aspiration and saline meniscus test.  Upon insufflation of the abdominal cavity with carbon dioxide to a well-tolerated pressure of 12-15 mmHg, 5 mm peri-umbilical port followed by laparoscope were inserted and used to inspect the abdominal cavity and its contents with no injuries from insertion of the first trochar noted. Three additional trocars were inserted, one at the epigastric position (10 mm) and two along the Right costal margin (5 mm). The table was then placed in reverse Trendelenburg position with the Right side up. Filmy adhesions between the gallbladder and omentum/duodenum/transverse colon were lysed using combined blunt dissection and selective electrocautery. The apex/dome of the gallbladder was grasped with an atraumatic grasper passed through the lateral port and retracted apically over the liver. The infundibulum was also grasped and retracted, exposing Calot's triangle. The peritoneum overlying the gallbladder infundibulum was incised and dissected free of surrounding peritoneal attachments, revealing the cystic duct and cystic artery, the cystic duct of which was clipped once on the gallbladder specimen side. Cystic ductotomy was then made using endoshear scissors, and cholangiocatheter was inserted into the cystic duct, through which cholangiogram was performed, revealing patent hepatic ducts, cystic ducts, and common bile duct, though only contrast draining into duodenum visualized on initial images of cholangiogram, requiring  additional contrast to be injected and introduction of some gas bubbles into the CBD, most - all of which were flushed through in order to obtain images without concern for filling defect(s). Cholangiocatheter was then  removed, and both the cystic duct and cystic artery were clipped twice on the patient side and once on the gallbladder specimen side close to the gallbladder. The gallbladder was then dissected from its peritoneal attachments to the liver using electrocautery, and the gallbladder was placed into a laparoscopic specimen bag and removed from the abdominal cavity via the epigastric port site. Hemostasis and secure placement of clips were confirmed, and intra-peritoneal cavity was inspected with no additional findings. PMI laparoscopic fascial closure device was then used to re-approximate fascia at the 10 mm epigastric port site.  All ports were then removed under direct visualization, and abdominal cavity was desuflated. All port sites were irrigated/cleaned, additional local anesthetic was injected at each incision, 3-0 Vicryl was used to re-approximate dermis at 10 mm port site(s), and subcuticular 4-0 Monocryl suture was used to re-approximate skin. Skin was then cleaned, dried, and sterile skin glue was applied. Patient was then safely able to be awakened, extubated, and transferred to PACU for post-operative monitoring and care.   I was present for all aspects of procedure, and there were no intra-operative complications apparent.

## 2017-07-19 NOTE — ED Provider Notes (Signed)
[facility] Emergency Department Provider Note   ____________________________________________   First MD Initiated Contact with Patient 07/19/17 [phone]     (approximate)  I have reviewed the triage vital signs and the nursing notes.   HISTORY  Chief Complaint Chest Pain    HPI Amber Donaldson is a 37 y.o. female who presents to the ED from home with a chief complaint of back and chest pain.  Patient reports approximately 7 PM last night she was folding laundry and had onset of sharp midline thoracic back pain which radiated into her chest.  Symptoms were associated with several episodes of vomiting and shortness of breath.  Patient had similar symptoms approximately 1 month ago which resolved after several hours.  Denies associated diaphoresis, palpitations, dizziness.  Denies recent fever, chills, abdominal pain, dysuria, diarrhea.  Denies recent travel, trauma or hormone use.   Past Medical History:  Diagnosis Date  . History of chicken pox   . History of migraines    1/month, excedrin controls  . Smoker     There are no active problems to display for this patient.   Past Surgical History:  Procedure Laterality Date  . INDUCED ABORTION  2001  . NO PAST SURGERIES      Prior to Admission medications   Medication Sig Start Date End Date Taking? Authorizing Provider  cyanocobalamin 500 MCG tablet Take 500 mcg by mouth daily.      [provider]  ibuprofen (ADVIL,MOTRIN) 600 MG tablet Take 1 tablet (600 mg total) by mouth every 6 (six) hours. 01/14/15   Marta Antu, CNM  norethindrone-ethinyl estradiol (JUNEL FE 1/20) 1-20 MG-MCG tablet Take 1 tablet by mouth daily. 01/14/15   Marta Antu, CNM  Prenatal Vit-Fe Fumarate-FA (PRENATAL MULTIVITAMIN) TABS tablet Take 1 tablet by mouth daily at 12 noon.    [provider]  pyridoxine (B-6) 100 MG tablet Take 100 mg by mouth daily.      [provider]     Allergies Patient has no known allergies.  Family History  Problem Relation Age of Onset  . Cancer Mother 92  . Heart disease Paternal Uncle   . Heart disease Paternal Grandfather   . Diabetes Cousin   . Stroke Neg Hx     Social History Social History   Tobacco Use  . Smoking status: Current Every Day Smoker    Packs/day: 0.30    Types: Cigarettes  . Smokeless tobacco: Never Used  Substance Use Topics  . Alcohol use: Yes  . Drug use: No    Review of Systems  Constitutional: No fever/chills. Eyes: No visual changes. ENT: No sore throat. Cardiovascular: Positive for chest pain. Respiratory: Positive for shortness of breath. Gastrointestinal: No abdominal pain.  Positive for nausea and vomiting.  No diarrhea.  No constipation. Genitourinary: Negative for dysuria. Musculoskeletal: Positive for back pain. Skin: Negative for rash. Neurological: Negative for headaches, focal weakness or numbness.   ____________________________________________   PHYSICAL EXAM:  VITAL SIGNS: ED Triage Vitals  Enc Vitals Group     BP 07/19/17 0107 (!) 136/96     Pulse Rate 07/19/17 0107 78     Resp 07/19/17 0107 18     Temp 07/19/17 0107 97.6 F (36.4 C)     Temp Source 07/19/17 0107 Oral     SpO2 07/19/17 0107 100 %     Weight 07/19/17 0105 150 lb (68 kg)     Height 07/19/17 0105 5\' 5"  (1.651 m)  Head Circumference --      Peak Flow --      Pain Score 07/19/17 0104 8     Pain Loc --      Pain Edu? --      Excl. in GC? --     Constitutional: Alert and oriented. Well appearing and in no acute distress. Eyes: Conjunctivae are normal. PERRL. EOMI. Head: Atraumatic. Nose: No congestion/rhinnorhea. Mouth/Throat: Mucous membranes are moist.  Oropharynx non-erythematous. Neck: No stridor.   Cardiovascular: Normal rate, regular rhythm. Grossly normal heart sounds.  Good peripheral circulation. Respiratory: Normal respiratory effort.  No retractions. Lungs  CTAB. Gastrointestinal: Soft and nontender to light or deep palpation. No distention. No abdominal bruits. No CVA tenderness. Musculoskeletal: No midline thoracic tenderness.  No lower extremity tenderness nor edema.  No joint effusions. Neurologic:  Normal speech and language. No gross focal neurologic deficits are appreciated. No gait instability. Skin:  Skin is warm, dry and intact. No rash noted. Psychiatric: Mood and affect are normal. Speech and behavior are normal.  ____________________________________________   LABS (all labs ordered are listed, but only abnormal results are displayed)  Labs Reviewed  BASIC METABOLIC PANEL - Abnormal; Notable for the following components:      Result Value   Glucose, Bld 100 (*)    Creatinine, Ser 1.09 (*)    All other components within normal limits  HEPATIC FUNCTION PANEL - Abnormal; Notable for the following components:   Alkaline Phosphatase 34 (*)    Total Bilirubin 1.4 (*)    Indirect Bilirubin 1.2 (*)    All other components within normal limits  CBC  TROPONIN I  TROPONIN I  LIPASE, BLOOD  POC URINE PREG, ED   ____________________________________________  EKG  ED ECG REPORT I, Delvin Hedeen J, the attending physician, personally viewed and interpreted this ECG.   Date: 07/19/2017  EKG Time: 0107  Rate: 62  Rhythm: normal EKG, normal sinus rhythm  Axis: Normal  Intervals:none  ST&T Change: Nonspecific  ____________________________________________  RADIOLOGY  ED MD interpretation: No acute cardiopulmonary process, no PE  Official radiology report(s): Dg Chest 2 View  Result Date: 07/19/2017 CLINICAL DATA:  Acute onset of generalized chest pain. EXAM: CHEST  2 VIEW COMPARISON:  None. FINDINGS: The lungs are well-aerated and clear. There is no evidence of focal opacification, pleural effusion or pneumothorax. The heart is normal in size; the mediastinal contour is within normal limits. No acute osseous abnormalities are  seen. IMPRESSION: No acute cardiopulmonary process seen. Electronically Signed   By: Roanna RaiderJeffery  Chang M.D.   On: 07/19/2017 01:19   Ct Angio Chest Pe W/cm &/or Wo Cm  Result Date: 07/19/2017 CLINICAL DATA:  Acute onset of upper back pain, radiating to the chest. Vomiting and shortness of breath. EXAM: CT ANGIOGRAPHY CHEST WITH CONTRAST TECHNIQUE: Multidetector CT imaging of the chest was performed using the standard protocol during bolus administration of intravenous contrast. Multiplanar CT image reconstructions and MIPs were obtained to evaluate the vascular anatomy. CONTRAST:  75mL ISOVUE-370 IOPAMIDOL (ISOVUE-370) INJECTION 76% COMPARISON:  Chest radiograph performed earlier today at 1:10 a.m. FINDINGS: Cardiovascular:  There is no evidence of pulmonary embolus. The heart is normal in size. The thoracic aorta is unremarkable in appearance. The great vessels are within normal limits. Mediastinum/Nodes: The mediastinum is unremarkable in appearance. No mediastinal lymphadenopathy is seen. No pericardial effusion is identified. Residual thymic tissue is within normal limits. The visualized portions of the thyroid gland are unremarkable. No axillary lymphadenopathy is appreciated. Lungs/Pleura: Minimal  bibasilar atelectasis is noted. No pleural effusion or pneumothorax is seen. No masses are identified. Upper Abdomen: The visualized portions of the liver and spleen are unremarkable. The visualized portions of the pancreas and stomach are within normal limits. Musculoskeletal: No acute osseous abnormalities are identified. The visualized musculature is unremarkable in appearance. Review of the MIP images confirms the above findings. IMPRESSION: No evidence of pulmonary embolus. Minimal bibasilar atelectasis; lungs otherwise clear. Electronically Signed   By: Roanna Raider M.D.   On: 07/19/2017 06:02    ____________________________________________   PROCEDURES  Procedure(s) performed:  None  Procedures  Critical Care performed: No  ____________________________________________   INITIAL IMPRESSION / ASSESSMENT AND PLAN / ED COURSE  As part of my medical decision making, I reviewed the following data within the electronic MEDICAL RECORD NUMBER Nursing notes reviewed and incorporated, Labs reviewed, EKG interpreted, Old chart reviewed, Radiograph reviewed and Notes from prior ED visits.   37 year old otherwise healthy female who presents with thoracic back pain radiating into her chest associated with vomiting and shortness of breath. Differential diagnosis includes, but is not limited to, ACS, aortic dissection, pulmonary embolism, cardiac tamponade, pneumothorax, pneumonia, pericarditis, myocarditis, GI-related causes including esophagitis/gastritis, and musculoskeletal chest wall pain.    Initial EKG, troponin, chest x-ray unremarkable.  Blood pressures symmetrical in both upper extremities (RUE 125/91, LUE 120/85) with strong peripheral pulses.  No chest pain currently.  Thoracic back pain 3/10.  Symptoms concerning for PE; ACS, dissection less likely.  Will repeat troponin, check LFTs and lipase, and obtain CT chest to evaluate for PE.  Clinical Course as of Jul 19 726  Wynelle Link Jul 19, 2017  4098 Updated patient of CT results.  Noted elevated T bili which may be secondary to vomiting, Gilbert's disease, or cholecystitis.  Will obtain right upper quadrant ultrasound.  Discussed with patient if ultrasound is normal, she may be discharged home to follow-up with cardiology.  Care will be transferred to the oncoming provider pending ultrasound result.  [JS]    Clinical Course User Index [JS] Irean Hong, MD     ____________________________________________   FINAL CLINICAL IMPRESSION(S) / ED DIAGNOSES  Final diagnoses:  Nonspecific chest pain  Acute midline thoracic back pain     ED Discharge Orders    None       Note:  This document was prepared using Dragon  voice recognition software and may include unintentional dictation errors.    Irean Hong, MD 07/19/17 [phone]

## 2017-07-19 NOTE — H&P (Signed)
SURGICAL ADMISSION HISTORY AND PHYSICAL  HISTORY OF PRESENT ILLNESS (HPI):  37 y.o. female presented to [facility] ED for evaluation of abdominal pain. Patient reports she developed Right flank and Right upper back pain yesterday evening after eating buttered popcorn. She says she'd experienced similar previously, usually after eating, and attributed the pain to her kidney (no history of nephrolithiasis), for which she drank cranberry juice. After eating pizza at 7 pm, however, her pain worsened significantly and began radiating to her chest and abdomen. Patient was evaluated in ED for cardiac and pulmonary etiologies, including CTA chest, all of which have reportedly been unremarkable. Abdominal ultrasound was then performed. Patient states she also experienced similar pain 1 month ago and frequently experiences post-prandial RUQ abdominal pain, adding that she typically eats "very unhealthy" with much fast food and fried meats and cheese. She currently says her pain has improved, and she denies fever/chills, further CP or SOB. She lastly also says she is able to walk several blocks or run up/down a flight of steps without having to stop for CP or SOB.  Surgery is consulted by ED physician Dr. Alphonzo Lemmings in this context for evaluation and management of acute cholecystitis.  PAST MEDICAL HISTORY (PMH):  Past Medical History:  Diagnosis Date  . History of chicken pox   . History of migraines    1/month, excedrin controls  . Smoker      PAST SURGICAL HISTORY (PSH):  Past Surgical History:  Procedure Laterality Date  . INDUCED ABORTION  2001  . NO PAST SURGERIES       MEDICATIONS:  Prior to Admission medications   Medication Sig Start Date End Date Taking? Authorizing Provider  ibuprofen (ADVIL,MOTRIN) 600 MG tablet Take 1 tablet (600 mg total) by mouth every 6 (six) hours. Patient not taking: Reported on 07/19/2017 01/14/15   Marta Antu, CNM  norethindrone-ethinyl estradiol (JUNEL FE 1/20) 1-20  MG-MCG tablet Take 1 tablet by mouth daily. Patient not taking: Reported on 07/19/2017 01/14/15   Marta Antu, CNM     ALLERGIES:  No Known Allergies   SOCIAL HISTORY:  Social History   Socioeconomic History  . Marital status: Married    Spouse name: Not on file  . Number of children: Not on file  . Years of education: Not on file  . Highest education level: Not on file  Social Needs  . Financial resource strain: Not on file  . Food insecurity - worry: Not on file  . Food insecurity - inability: Not on file  . Transportation needs - medical: Not on file  . Transportation needs - non-medical: Not on file  Occupational History  . Not on file  Tobacco Use  . Smoking status: Current Every Day Smoker    Packs/day: 0.30    Types: Cigarettes  . Smokeless tobacco: Never Used  Substance and Sexual Activity  . Alcohol use: Yes  . Drug use: No  . Sexual activity: Not on file  Other Topics Concern  . Not on file  Social History Narrative   Caffeine: 1-2 sodas/day   Lives with husband, 1 daughter (2010), no pets   Occupation: stay at home mom   Edu: Some college   Activity: home exercise regimen 15 min/day   Diet: some fruits/vegetables, good water, red meat QOweek, rare fish    The patient currently resides (home / rehab facility / nursing home): Home The patient normally is (ambulatory / bedbound): Ambulatory   FAMILY HISTORY:  Family History  Problem Relation  Age of Onset  . Cancer Mother 6  . Heart disease Paternal Uncle   . Heart disease Paternal Grandfather   . Diabetes Cousin   . Stroke Neg Hx      REVIEW OF SYSTEMS:  Constitutional: denies weight loss, fever, chills, or sweats  Eyes: denies any other vision changes, history of eye injury  ENT: denies sore throat, hearing problems  Respiratory: denies shortness of breath, wheezing  Cardiovascular: denies chest pain, palpitations  Gastrointestinal: abdominal pain, N/V, and bowel function as per  HPI Genitourinary: denies burning with urination or urinary frequency Musculoskeletal: denies any other joint pains or cramps  Skin: denies any other rashes or skin discolorations  Neurological: denies any other headache, dizziness, weakness  Psychiatric: denies any other depression, anxiety   All other review of systems were negative   VITAL SIGNS:  Temp:  [97.6 F (36.4 C)-98.1 F (36.7 C)] 98 F (36.7 C) (02/10 0916) Pulse Rate:  [61-78] 66 (02/10 0916) Resp:  [16-18] 16 (02/10 0916) BP: (115-136)/(66-96) 117/66 (02/10 0916) SpO2:  [98 %-100 %] 98 % (02/10 0916) Weight:  [150 lb (68 kg)] 150 lb (68 kg) (02/10 0105)     Height: 5\' 5"  (165.1 cm) Weight: 150 lb (68 kg) BMI (Calculated): 24.96   INTAKE/OUTPUT:  This shift: No intake/output data recorded.  Last 2 shifts: @IOLAST2SHIFTS @   PHYSICAL EXAM:  Constitutional:  -- Normal body habitus  -- Awake, alert, and oriented x3, no apparent distress Eyes:  -- Pupils equally round and reactive to light  -- No scleral icterus, B/L no occular discharge Ear, nose, throat: -- Neck is FROM WNL -- No jugular venous distension  Pulmonary:  -- No wheezes or rhales -- Equal breath sounds bilaterally -- Breathing non-labored at rest Cardiovascular:  -- S1, S2 present  -- No pericardial rubs  Gastrointestinal:  -- Abdomen soft, nontender, non-distended, no guarding or rebound tenderness -- No abdominal masses appreciated, pulsatile or otherwise  Musculoskeletal and Integumentary:  -- Wounds or skin discoloration: None appreciated -- Extremities: B/L UE and LE FROM, hands and feet warm, no edema  Neurologic:  -- Motor function: Intact and symmetric -- Sensation: Intact and symmetric Psychiatric:  -- Mood and affect WNL  Labs:  CBC Latest Ref Rng & Units 07/19/2017 01/14/2015 01/13/2015  WBC 3.6 - 11.0 K/uL 7.9 - 8.0  Hemoglobin 12.0 - 16.0 g/dL 16.1 09.6 04.5  Hematocrit 35.0 - 47.0 % 46.1 39.0 39.6  Platelets 150 - 440 K/uL 246  - 184   CMP Latest Ref Rng & Units 07/19/2017 05/13/2011  Glucose 65 - 99 mg/dL 409(W) 90  BUN 6 - 20 mg/dL 17 10  Creatinine 1.19 - 1.00 mg/dL 1.47(W) 1.0  Sodium 295 - 145 mmol/L 138 140  Potassium 3.5 - 5.1 mmol/L 3.7 4.2  Chloride 101 - 111 mmol/L 104 107  CO2 22 - 32 mmol/L 24 26  Calcium 8.9 - 10.3 mg/dL 9.5 8.9  Total Protein 6.5 - 8.1 g/dL 6.6 6.7  Total Bilirubin 0.3 - 1.2 mg/dL 6.2(Z) 3.0(Q)  Alkaline Phos 38 - 126 U/L 34(L) 40  AST 15 - 41 U/L 16 19  ALT 14 - 54 U/L 25 20   Imaging studies:  Limited RUQ Abdominal Ultrasound (07/19/2017) - personally reviewed and discussed with patient There is large area of shadowing at the neck of the gallbladder. Reportedly the gallbladder has a fold such that this stone is seen with gallbladder lumen adjacent to it on some images. The gallbladder wall  is thickened to 9 mm and focally tender.  Common bile duct diameter: 4 mm. No filling defect where visualized.  On a few images there is question of intrahepatic biliary dilatation. Mirizzi syndrome is considered. No evidence of mass lesion. Portal vein is patent on color Doppler imaging with normal direction of blood flow towards the liver.  CTA Chest (07/19/2017) No evidence of pulmonary embolus. Minimal bibasilar atelectasis; lungs otherwise clear.  Assessment/Plan: (ICD-10's: K80.01) 37 y.o. 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   - NPO, IV fluids, ceftriaxone  - all risks, benefits, and alternatives to cholecystectomy with intraoperative cholangiogram were discussed with the patient, all of her questions were answered to her expressed satisfaction, patient expresses she wishes to proceed, and informed consent was obtained.  - also discussed  with patient possibility for ERCP and will repeat LFT's tomorrow morning   - will plan for laparoscopic cholecystectomy today pending OR availability  - DVT prophylaxis  All of the above findings and recommendations were discussed with the patient and ED physician, and all of patient's questions were answered to her expressed satisfaction.  -- Scherrie GerlachJason E. Earlene Plateravis, MD, RPVI [place]: [facility] - Partnering for exceptional care. Office: [phone]

## 2017-07-19 NOTE — Consult Note (Signed)
SURGICAL CONSULTATION NOTE (initial) - cpt: 16109  HISTORY OF PRESENT ILLNESS (HPI):  37 y.o. female presented to [facility] ED for evaluation of abdominal pain. Patient reports she developed Right flank and Right upper back pain yesterday evening after eating buttered popcorn. She says she'd experienced similar previously, usually after eating, and attributed the pain to her kidney (no history of nephrolithiasis), for which she drank cranberry juice. After eating pizza at 7 pm, however, her pain worsened significantly and began radiating to her chest and abdomen. Patient was evaluated in ED for cardiac and pulmonary etiologies, including CTA chest, all of which have reportedly been unremarkable. Abdominal ultrasound was then performed. Patient states she also experienced similar pain 1 month ago and frequently experiences post-prandial RUQ abdominal pain, adding that she typically eats "very unhealthy" with much fast food and fried meats and cheese. She currently says her pain has improved, and she denies fever/chills, further CP or SOB. She lastly also says she is able to walk several blocks or run up/down a flight of steps without having to stop for CP or SOB.  Surgery is consulted by ED physician Dr. Alphonzo Lemmings in this context for evaluation and management of acute cholecystitis.  PAST MEDICAL HISTORY (PMH):  Past Medical History:  Diagnosis Date  . History of chicken pox   . History of migraines    1/month, excedrin controls  . Smoker      PAST SURGICAL HISTORY (PSH):  Past Surgical History:  Procedure Laterality Date  . INDUCED ABORTION  2001  . NO PAST SURGERIES       MEDICATIONS:  Prior to Admission medications   Medication Sig Start Date End Date Taking? Authorizing Provider  ibuprofen (ADVIL,MOTRIN) 600 MG tablet Take 1 tablet (600 mg total) by mouth every 6 (six) hours. Patient not taking: Reported on 07/19/2017 01/14/15   Marta Antu, CNM  norethindrone-ethinyl estradiol (JUNEL FE  1/20) 1-20 MG-MCG tablet Take 1 tablet by mouth daily. Patient not taking: Reported on 07/19/2017 01/14/15   Marta Antu, CNM     ALLERGIES:  No Known Allergies   SOCIAL HISTORY:  Social History   Socioeconomic History  . Marital status: Married    Spouse name: Not on file  . Number of children: Not on file  . Years of education: Not on file  . Highest education level: Not on file  Social Needs  . Financial resource strain: Not on file  . Food insecurity - worry: Not on file  . Food insecurity - inability: Not on file  . Transportation needs - medical: Not on file  . Transportation needs - non-medical: Not on file  Occupational History  . Not on file  Tobacco Use  . Smoking status: Current Every Day Smoker    Packs/day: 0.30    Types: Cigarettes  . Smokeless tobacco: Never Used  Substance and Sexual Activity  . Alcohol use: Yes  . Drug use: No  . Sexual activity: Not on file  Other Topics Concern  . Not on file  Social History Narrative   Caffeine: 1-2 sodas/day   Lives with husband, 1 daughter (2010), no pets   Occupation: stay at home mom   Edu: Some college   Activity: home exercise regimen 15 min/day   Diet: some fruits/vegetables, good water, red meat QOweek, rare fish    The patient currently resides (home / rehab facility / nursing home): Home The patient normally is (ambulatory / bedbound): Ambulatory   FAMILY HISTORY:  Family History  Problem Relation Age of Onset  . Cancer Mother 109  . Heart disease Paternal Uncle   . Heart disease Paternal Grandfather   . Diabetes Cousin   . Stroke Neg Hx      REVIEW OF SYSTEMS:  Constitutional: denies weight loss, fever, chills, or sweats  Eyes: denies any other vision changes, history of eye injury  ENT: denies sore throat, hearing problems  Respiratory: denies shortness of breath, wheezing  Cardiovascular: denies chest pain, palpitations  Gastrointestinal: abdominal pain, N/V, and bowel function as per  HPI Genitourinary: denies burning with urination or urinary frequency Musculoskeletal: denies any other joint pains or cramps  Skin: denies any other rashes or skin discolorations  Neurological: denies any other headache, dizziness, weakness  Psychiatric: denies any other depression, anxiety   All other review of systems were negative   VITAL SIGNS:  Temp:  [97.6 F (36.4 C)-98.1 F (36.7 C)] 98 F (36.7 C) (02/10 0916) Pulse Rate:  [61-78] 66 (02/10 0916) Resp:  [16-18] 16 (02/10 0916) BP: (115-136)/(66-96) 117/66 (02/10 0916) SpO2:  [98 %-100 %] 98 % (02/10 0916) Weight:  [150 lb (68 kg)] 150 lb (68 kg) (02/10 0105)     Height: 5\' 5"  (165.1 cm) Weight: 150 lb (68 kg) BMI (Calculated): 24.96   INTAKE/OUTPUT:  This shift: No intake/output data recorded.  Last 2 shifts: @IOLAST2SHIFTS @   PHYSICAL EXAM:  Constitutional:  -- Normal body habitus  -- Awake, alert, and oriented x3, no apparent distress Eyes:  -- Pupils equally round and reactive to light  -- No scleral icterus, B/L no occular discharge Ear, nose, throat: -- Neck is FROM WNL -- No jugular venous distension  Pulmonary:  -- No wheezes or rhales -- Equal breath sounds bilaterally -- Breathing non-labored at rest Cardiovascular:  -- S1, S2 present  -- No pericardial rubs  Gastrointestinal:  -- Abdomen soft, nontender, non-distended, no guarding or rebound tenderness -- No abdominal masses appreciated, pulsatile or otherwise  Musculoskeletal and Integumentary:  -- Wounds or skin discoloration: None appreciated -- Extremities: B/L UE and LE FROM, hands and feet warm, no edema  Neurologic:  -- Motor function: Intact and symmetric -- Sensation: Intact and symmetric Psychiatric:  -- Mood and affect WNL  Labs:  CBC Latest Ref Rng & Units 07/19/2017 01/14/2015 01/13/2015  WBC 3.6 - 11.0 K/uL 7.9 - 8.0  Hemoglobin 12.0 - 16.0 g/dL 40.9 81.1 91.4  Hematocrit 35.0 - 47.0 % 46.1 39.0 39.6  Platelets 150 - 440 K/uL 246  - 184   CMP Latest Ref Rng & Units 07/19/2017 05/13/2011  Glucose 65 - 99 mg/dL 782(N) 90  BUN 6 - 20 mg/dL 17 10  Creatinine 5.62 - 1.00 mg/dL 1.30(Q) 1.0  Sodium 657 - 145 mmol/L 138 140  Potassium 3.5 - 5.1 mmol/L 3.7 4.2  Chloride 101 - 111 mmol/L 104 107  CO2 22 - 32 mmol/L 24 26  Calcium 8.9 - 10.3 mg/dL 9.5 8.9  Total Protein 6.5 - 8.1 g/dL 6.6 6.7  Total Bilirubin 0.3 - 1.2 mg/dL 8.4(O) 9.6(E)  Alkaline Phos 38 - 126 U/L 34(L) 40  AST 15 - 41 U/L 16 19  ALT 14 - 54 U/L 25 20   Imaging studies:  Limited RUQ Abdominal Ultrasound (07/19/2017) - personally reviewed and discussed with patient There is large area of shadowing at the neck of the gallbladder. Reportedly the gallbladder has a fold such that this stone is seen with gallbladder lumen adjacent to it on some images. The  gallbladder wall is thickened to 9 mm and focally tender.  Common bile duct diameter: 4 mm. No filling defect where visualized.  On a few images there is question of intrahepatic biliary dilatation. Mirizzi syndrome is considered. No evidence of mass lesion. Portal vein is patent on color Doppler imaging with normal direction of blood flow towards the liver.  CTA Chest (07/19/2017) No evidence of pulmonary embolus. Minimal bibasilar atelectasis; lungs otherwise clear.  Assessment/Plan: (ICD-10's: K80.01) 37 y.o. 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   - NPO, IV fluids, ceftriaxone  - all risks, benefits, and alternatives to cholecystectomy with intraoperative cholangiogram were discussed with the patient, all of her questions were answered to her expressed satisfaction, patient expresses she wishes to proceed, and informed consent was obtained.  - also discussed  with patient possibility for ERCP and will repeat LFT's tomorrow morning   - will plan for laparoscopic cholecystectomy today pending OR availability  - DVT prophylaxis  All of the above findings and recommendations were discussed with the patient and ED physician, and all of patient's questions were answered to her expressed satisfaction.  Thank you for the opportunity to participate in this patient's care.   -- Scherrie GerlachJason E. Earlene Plateravis, MD, RPVI [place] - Partnering for exceptional care. Office: [phone]

## 2017-07-19 NOTE — ED Notes (Signed)
Spoke with dr. Earlene Plateravis. Pt will be going to surgery in approx an hour. Consent signed. Pt aware.

## 2017-07-19 NOTE — Anesthesia Procedure Notes (Signed)
Procedure Name: Intubation Date/Time: 07/19/2017 3:00 PM Performed by: Allean Found, CRNA Pre-anesthesia Checklist: Patient identified, Emergency Drugs available, Suction available, Patient being monitored and Timeout performed Patient Re-evaluated:Patient Re-evaluated prior to induction Oxygen Delivery Method: Circle system utilized Preoxygenation: Pre-oxygenation with 100% oxygen Induction Type: IV induction Ventilation: Mask ventilation without difficulty Laryngoscope Size: Mac and 3 Grade View: Grade I Tube type: Oral Tube size: 7.0 mm Number of attempts: 1 Airway Equipment and Method: Stylet Placement Confirmation: ETT inserted through vocal cords under direct vision,  positive ETCO2 and breath sounds checked- equal and bilateral Secured at: 21 cm Tube secured with: Tape Dental Injury: Teeth and Oropharynx as per pre-operative assessment

## 2017-07-19 NOTE — Anesthesia Preprocedure Evaluation (Signed)
Anesthesia Evaluation  Patient identified by MRN, date of birth, ID band Patient awake    Reviewed: Allergy & Precautions, NPO status , Patient's Chart, lab work & pertinent test results  History of Anesthesia Complications Negative for: history of anesthetic complications  Airway Mallampati: II  TM Distance: >3 FB Neck ROM: Full    Dental  (+) Implants   Pulmonary neg sleep apnea, neg COPD, Current Smoker,    breath sounds clear to auscultation- rhonchi (-) wheezing      Cardiovascular Exercise Tolerance: Good (-) hypertension(-) CAD, (-) Past MI and (-) Cardiac Stents  Rhythm:Regular Rate:Normal - Systolic murmurs and - Diastolic murmurs    Neuro/Psych negative neurological ROS  negative psych ROS   GI/Hepatic negative GI ROS, Neg liver ROS,   Endo/Other  negative endocrine ROSneg diabetes  Renal/GU negative Renal ROS     Musculoskeletal negative musculoskeletal ROS (+)   Abdominal (+) - obese,   Peds  Hematology negative hematology ROS (+)   Anesthesia Other Findings Past Medical History: No date: History of chicken pox No date: History of migraines     Comment:  1/month, excedrin controls No date: Smoker   Reproductive/Obstetrics                             Anesthesia Physical Anesthesia Plan  ASA: II  Anesthesia Plan: General   Post-op Pain Management:    Induction: Intravenous  PONV Risk Score and Plan: 1 and Ondansetron, Dexamethasone and Midazolam  Airway Management Planned: Oral ETT  Additional Equipment:   Intra-op Plan:   Post-operative Plan: Extubation in OR  Informed Consent: I have reviewed the patients History and Physical, chart, labs and discussed the procedure including the risks, benefits and alternatives for the proposed anesthesia with the patient or authorized representative who has indicated his/her understanding and acceptance.   Dental advisory  given  Plan Discussed with: CRNA and Anesthesiologist  Anesthesia Plan Comments:         Anesthesia Quick Evaluation

## 2017-07-19 NOTE — ED Provider Notes (Signed)
-----------------------------------------   9:38 AM on 07/19/2017 -----------------------------------------  abd benign at this time but concerning US. I d/w Dr. Earlene Plateravis, he will come see pt. She is in nad at this time.    Jeanmarie PlantMcShane, Owyn Raulston A, MD 07/19/17 [phone]

## 2017-07-19 NOTE — ED Notes (Signed)
Pt to or.

## 2017-07-19 NOTE — Discharge Instructions (Addendum)
In addition to included general post-operative instructions for Laparoscopic Cholecystectomy,  Diet: Resume home heart healthy diet (as discussed).   Activity: No heavy lifting >20 pounds (children, pets, laundry, garbage) or strenuous activity until follow-up, but light activity and walking are encouraged. Do not drive or drink alcohol if taking narcotic pain medications.  Wound care: 2 days after surgery (Tuesday, 2/12), may shower/get incision wet with soapy water and pat dry (do not rub incisions), but no baths or submerging incision underwater until follow-up.   Medications: Resume all home medications. For mild to moderate pain: acetaminophen (Tylenol) or ibuprofen/naproxen (if no kidney disease). Combining Tylenol with alcohol can substantially increase your risk of causing liver disease. Narcotic pain medications, if prescribed, can be used for severe pain, though may cause nausea, constipation, and drowsiness. Do not combine Tylenol and Percocet (or similar) within a 6 hour period as Percocet (and similar) contain(s) Tylenol. If you do not need the narcotic pain medication, you do not need to fill the prescription.  Call office [phone]) at any time if any questions, worsening pain, fevers/chills, bleeding, drainage from incision site, or other concerns.

## 2017-07-19 NOTE — Transfer of Care (Signed)
Immediate Anesthesia Transfer of Care Note  Patient: Amber Donaldson  Procedure(s) Performed: LAPAROSCOPIC CHOLECYSTECTOMY WITH INTRAOPERATIVE CHOLANGIOGRAM (N/A )  Patient Location: PACU  Anesthesia Type:General  Level of Consciousness: awake  Airway & Oxygen Therapy: Patient Spontanous Breathing and Patient connected to face mask oxygen  Post-op Assessment: Report given to RN and Post -op Vital signs reviewed and stable  Post vital signs: Reviewed and stable  Last Vitals:  Vitals:   07/19/17 1400 07/19/17 1731  BP: 124/78 112/84  Pulse: 66 93  Resp:  (!) 23  Temp:  36.9 C  SpO2: 99% 100%    Last Pain:  Vitals:   07/19/17 1102  TempSrc: Oral  PainSc:          Complications: No apparent anesthesia complications

## 2017-07-20 ENCOUNTER — Encounter: Payer: Self-pay | Admitting: Surgery

## 2017-07-20 LAB — COMPREHENSIVE METABOLIC PANEL
ALK PHOS: 31 U/L — AB (ref 38–126)
ALT: 26 U/L (ref 14–54)
AST: 24 U/L (ref 15–41)
Albumin: 3.2 g/dL — ABNORMAL LOW (ref 3.5–5.0)
Anion gap: 7 (ref 5–15)
BILIRUBIN TOTAL: 1.7 mg/dL — AB (ref 0.3–1.2)
BUN: 9 mg/dL (ref 6–20)
CALCIUM: 8.2 mg/dL — AB (ref 8.9–10.3)
CO2: 20 mmol/L — ABNORMAL LOW (ref 22–32)
CREATININE: 0.84 mg/dL (ref 0.44–1.00)
Chloride: 109 mmol/L (ref 101–111)
GFR calc Af Amer: >60 mL/min (ref >60)
GLUCOSE: 159 mg/dL — AB (ref 65–99)
Potassium: 4.2 mmol/L (ref 3.5–5.1)
Sodium: 136 mmol/L (ref 135–145)
TOTAL PROTEIN: 5.6 g/dL — AB (ref 6.5–8.1)

## 2017-07-20 LAB — POCT PREGNANCY, URINE: PREG TEST UR: NEGATIVE

## 2017-07-20 LAB — BILIRUBIN, DIRECT: Bilirubin, Direct: 0.3 mg/dL (ref 0.1–0.5)

## 2017-07-20 MED ORDER — OXYCODONE-ACETAMINOPHEN 5-325 MG PO TABS: 1.0000 | ORAL_TABLET | ORAL | 0 refills | Status: DC | PRN

## 2017-07-20 MED ORDER — IBUPROFEN 600 MG PO TABS: 600.0000 mg | ORAL_TABLET | Freq: Three times a day (TID) | ORAL | 0 refills | Status: AC | PRN

## 2017-07-20 NOTE — Anesthesia Postprocedure Evaluation (Signed)
Anesthesia Post Note  Patient: Amber DonathLaura Donaldson  Procedure(s) Performed: LAPAROSCOPIC CHOLECYSTECTOMY WITH INTRAOPERATIVE CHOLANGIOGRAM (N/A )  Patient location during evaluation: PACU Anesthesia Type: General Level of consciousness: awake and alert and oriented Pain management: pain level controlled Vital Signs Assessment: post-procedure vital signs reviewed and stable Respiratory status: spontaneous breathing, nonlabored ventilation and respiratory function stable Cardiovascular status: blood pressure returned to baseline and stable Postop Assessment: no signs of nausea or vomiting Anesthetic complications: no     Last Vitals:  Vitals:   07/19/17 1834 07/19/17 1956  BP: 113/77 114/80  Pulse: 64 (!) 59  Resp: 15 16  Temp: 37.1 C 36.7 C  SpO2: 100% 97%    Last Pain:  Vitals:   07/19/17 2038  TempSrc:   PainSc: 2                  Makyiah Lie

## 2017-07-20 NOTE — Progress Notes (Signed)
Discharge instructions reviewed with patient and spouse, with patient's permission. All questions answered and patient verbalized understanding. Rx given for Ibuprofen. Other Rx for Percocet sent to Pharmacy by MD; patient confirmed prior to departure. VSS, IV dc'ed, cath intact. Patient to be escorted to vehicle via wheelchair with spouse and belongings.

## 2017-07-20 NOTE — Discharge Summary (Signed)
Patient ID: Amber DonathLaura Donaldson MRN: [id number] DOB/AGE: 36-Dec-1982 36 y.o.  Admit date: 07/19/2017 Discharge date: 07/20/2017   Discharge Diagnoses:  Active Problems:   Acute cholecystitis due to biliary calculus   Procedures: Laparoscopic cholecystectomy with intraoperative cholangiogram  Hospital Course: Patient was admitted on 07/19/17 with acute cholecystitis.  She was taken to the operating room and had a laparoscopic Cholecystectomy with cholangiogram.  There were no filling defects on cholangiogram done.  Her diet was slowly advanced and she was tolerating well with appropriate pain control and ambulation.  Her LFTs the next morning reveal a total bilirubin of 1.7, however the direct component was only 0.3.  On exam, the patient was in no acute distress with stable vital signs.  Her abdomen was soft, nondistended, and appropriately tender to palpation over the incisions.  Her incisions were clean dry and intact with Dermabond in place and no evidence of infection.  Consults: None  Disposition: 01-Home or Self Care  Discharge Instructions    Call MD for:  difficulty breathing, headache or visual disturbances   Complete by:  As directed    Call MD for:  persistant nausea and vomiting   Complete by:  As directed    Call MD for:  redness, tenderness, or signs of infection (pain, swelling, redness, odor or green/yellow discharge around incision site)   Complete by:  As directed    Call MD for:  severe uncontrolled pain   Complete by:  As directed    Call MD for:  temperature >100.4   Complete by:  As directed    Diet - low sodium heart healthy   Complete by:  As directed    Discharge instructions   Complete by:  As directed    1.  Patient may shower, but do not scrub wound heavily and dab dry only. 2.  Do not submerge wounds in pool/tub 3.  Do not apply ointments or hydrogen peroxide to the wounds.   Driving Restrictions   Complete by:  As directed    Do not drive while taking  narcotics for pain control.   Increase activity slowly   Complete by:  As directed    Lifting restrictions   Complete by:  As directed    No heavy lifting or pushing of more than 10-15 lbs for 4 weeks.   No dressing needed   Complete by:  As directed      Allergies as of 07/20/2017   No Known Allergies     Medication List    TAKE these medications   ibuprofen 600 MG tablet Commonly known as:  ADVIL,MOTRIN Take 1 tablet (600 mg total) by mouth every 8 (eight) hours as needed for mild pain or moderate pain. What changed:    when to take this  reasons to take this   norethindrone-ethinyl estradiol 1-20 MG-MCG tablet Commonly known as:  JUNEL FE 1/20 Take 1 tablet by mouth daily.   oxyCODONE-acetaminophen 5-325 MG tablet Commonly known as:  PERCOCET/ROXICET Take 1-2 tablets by mouth every 4 (four) hours as needed for moderate pain.      Follow-up Information    Laurier NancyKhan, Shaukat A, MD. Schedule an appointment as soon as possible for a visit in 2 day(s).   Specialty:  Cardiology Contact information: [address] [place] [address] [phone]        Ancil Linseyavis, Jason Evan, MD Follow up in 2 week(s).   Specialty:  General Surgery Contact information: [address] [place]  [address] [phone]          

## 2017-07-21 LAB — SURGICAL PATHOLOGY

## 2017-08-03 ENCOUNTER — Encounter: Payer: Self-pay | Admitting: Surgery

## 2018-08-23 ENCOUNTER — Other Ambulatory Visit (HOSPITAL_COMMUNITY)
Admission: RE | Admit: 2018-08-23 | Discharge: 2018-08-23 | Disposition: A | Discharge status: Home / Self Care | Payer: 59 | Source: Ambulatory Visit | Attending: Obstetrics and Gynecology | Admitting: Obstetrics and Gynecology

## 2018-08-23 ENCOUNTER — Ambulatory Visit (INDEPENDENT_AMBULATORY_CARE_PROVIDER_SITE_OTHER): Payer: 59 | Admitting: Obstetrics and Gynecology

## 2018-08-23 ENCOUNTER — Other Ambulatory Visit: Payer: Self-pay

## 2018-08-23 ENCOUNTER — Encounter: Payer: Self-pay | Admitting: Obstetrics and Gynecology

## 2018-08-23 VITALS — BP 100/80 | HR 84 | Ht 65.0 in | Wt 162.0 lb

## 2018-08-23 DIAGNOSIS — Z124 Encounter for screening for malignant neoplasm of cervix: Secondary | ICD-10-CM

## 2018-08-23 DIAGNOSIS — Z1151 Encounter for screening for human papillomavirus (HPV): Secondary | ICD-10-CM

## 2018-08-23 DIAGNOSIS — Z803 Family history of malignant neoplasm of breast: Secondary | ICD-10-CM

## 2018-08-23 DIAGNOSIS — N926 Irregular menstruation, unspecified: Secondary | ICD-10-CM

## 2018-08-23 DIAGNOSIS — Z01419 Encounter for gynecological examination (general) (routine) without abnormal findings: Secondary | ICD-10-CM | POA: Diagnosis not present

## 2018-08-23 DIAGNOSIS — Z1239 Encounter for other screening for malignant neoplasm of breast: Secondary | ICD-10-CM

## 2018-08-23 DIAGNOSIS — Z30011 Encounter for initial prescription of contraceptive pills: Secondary | ICD-10-CM

## 2018-08-23 MED ORDER — DROSPIRENONE 4 MG PO TABS: 1.0000 | ORAL_TABLET | Freq: Every day | ORAL | 3 refills | Status: DC

## 2018-08-23 NOTE — Progress Notes (Signed)
PCP:  Patient, No Pcp Per   Chief Complaint  Patient presents with  . Gynecologic Exam    skipped feb period, had period last tuesday (lasted 3 days, abnormal, usually 5) has done 2 preg test, both neg- 3/4 and 3/6, has felt two lumps on cervix     HPI:      Ms. Amber Donaldson is a 38 y.o. [id number] who LMP was Patient's last menstrual period was 06/24/2018 (exact date)., presents today for her NP annual examination.  Her menses are usually regular every 28-30 days, lasting 5 days.  Dysmenorrhea mild, occurring first 1-2 days of flow. She does not have intermenstrual bleeding. She missed 2/20 menses and had lighter/shorter 3/20 menses. Pt had 2 neg UPT. No recent sickness/travel/stress but did gain wt last month, even though exercising more.   Sex activity: single partner, contraception - condoms; Would like to restart BC. Did OCPs in past without side effects, had wt gain with BC patch.   Last Pap: not recent Hx of STDs: chlamydia, HPV--s/p LEEP age 55, neg paps since Pt concerned about 2 bumps on cx.   Last mammogram: never There is a FH of breast cancer in her mom. Her mother was BRCA neg (? Update panel done). There is no FH of ovarian cancer. The patient does do self-breast exams.  Tobacco use: The patient currently smokes 1/2 packs of cigarettes per day for the past many years. Alcohol use: social drinker No drug use.  Exercise: moderately active  She does get adequate calcium but not Vitamin D in her diet.   Past Medical History:  Diagnosis Date  . Cervical dysplasia   . Family history of breast cancer   . History of chicken pox   . History of migraines    1/month, excedrin controls  . Smoker     Past Surgical History:  Procedure Laterality Date  . CHOLECYSTECTOMY N/A 07/19/2017   Procedure: LAPAROSCOPIC CHOLECYSTECTOMY WITH INTRAOPERATIVE CHOLANGIOGRAM;  Surgeon: Vickie Epley, MD;  Location: [facility] ORS;  Service: General;  Laterality: N/A;  . INDUCED ABORTION   2001  . LEEP  2001    Family History  Problem Relation Age of Onset  . Breast cancer Mother 24       BRCA neg  . Heart disease Paternal Uncle   . Heart disease Paternal Grandfather   . Diabetes Cousin   . Stroke Neg Hx   . Ovarian cancer Neg Hx     Social History   Socioeconomic History  . Marital status: Married    Spouse name: Not on file  . Number of children: Not on file  . Years of education: Not on file  . Highest education level: Not on file  Occupational History  . Not on file  Social Needs  . Financial resource strain: Not on file  . Food insecurity:    Worry: Not on file    Inability: Not on file  . Transportation needs:    Medical: Not on file    Non-medical: Not on file  Tobacco Use  . Smoking status: Current Every Day Smoker    Packs/day: 0.30    Types: Cigarettes  . Smokeless tobacco: Never Used  Substance and Sexual Activity  . Alcohol use: Yes  . Drug use: No  . Sexual activity: Yes    Birth control/protection: Condom  Lifestyle  . Physical activity:    Days per week: Not on file    Minutes per session: Not on file  .  Stress: Not on file  Relationships  . Social connections:    Talks on phone: Not on file    Gets together: Not on file    Attends religious service: Not on file    Active member of club or organization: Not on file    Attends meetings of clubs or organizations: Not on file    Relationship status: Not on file  . Intimate partner violence:    Fear of current or ex partner: Not on file    Emotionally abused: Not on file    Physically abused: Not on file    Forced sexual activity: Not on file  Other Topics Concern  . Not on file  Social History Narrative   Caffeine: 1-2 sodas/day   Lives with husband, 1 daughter (2010), no pets   Occupation: stay at home mom   Edu: Some college   Activity: home exercise regimen 15 min/day   Diet: some fruits/vegetables, good water, red meat QOweek, rare fish   Outpatient Medications  Prior to Visit  Medication Sig Dispense Refill  . ibuprofen (ADVIL,MOTRIN) 600 MG tablet Take 1 tablet (600 mg total) by mouth every 8 (eight) hours as needed for mild pain or moderate pain. (Patient not taking: Reported on 08/23/2018) 30 tablet 0  . norethindrone-ethinyl estradiol (JUNEL FE 1/20) 1-20 MG-MCG tablet Take 1 tablet by mouth daily. (Patient not taking: Reported on 07/19/2017) 1 Package 11  . oxyCODONE-acetaminophen (PERCOCET/ROXICET) 5-325 MG tablet Take 1-2 tablets by mouth every 4 (four) hours as needed for moderate pain. 30 tablet 0   No facility-administered medications prior to visit.      ROS:  Review of Systems  Constitutional: Negative for fatigue, fever and unexpected weight change.  Respiratory: Negative for cough, shortness of breath and wheezing.   Cardiovascular: Negative for chest pain, palpitations and leg swelling.  Gastrointestinal: Negative for blood in stool, constipation, diarrhea, nausea and vomiting.  Endocrine: Negative for cold intolerance, heat intolerance and polyuria.  Genitourinary: Negative for dyspareunia, dysuria, flank pain, frequency, genital sores, hematuria, menstrual problem, pelvic pain, urgency, vaginal bleeding, vaginal discharge and vaginal pain.  Musculoskeletal: Negative for back pain, joint swelling and myalgias.  Skin: Negative for rash.  Neurological: Negative for dizziness, syncope, light-headedness, numbness and headaches.  Hematological: Negative for adenopathy.  Psychiatric/Behavioral: Negative for agitation, confusion, sleep disturbance and suicidal ideas. The patient is not nervous/anxious.    BREAST: No symptoms   Objective: BP 100/80   Pulse 84   Ht 5' 5"  (1.651 m)   Wt 162 lb (73.5 kg)   LMP 06/24/2018 (Exact Date)   Breastfeeding No   BMI 26.96 kg/m    Physical Exam Constitutional:      Appearance: She is well-developed.  Genitourinary:     Vulva, vagina, cervix, uterus, right adnexa and left adnexa normal.      No vulval lesion or tenderness noted.     No vaginal discharge, erythema or tenderness.     Cervical nabothian cyst present.     No cervical polyp.     Uterus is not enlarged or tender.     No right or left adnexal mass present.     Right adnexa not tender.     Left adnexa not tender.  Neck:     Musculoskeletal: Normal range of motion.     Thyroid: No thyromegaly.  Cardiovascular:     Rate and Rhythm: Normal rate and regular rhythm.     Heart sounds: Normal heart sounds. No murmur.  Pulmonary:     Effort: Pulmonary effort is normal.     Breath sounds: Normal breath sounds.  Chest:     Breasts:        Right: No mass, nipple discharge, skin change or tenderness.        Left: No mass, nipple discharge, skin change or tenderness.  Abdominal:     Palpations: Abdomen is soft.     Tenderness: There is no abdominal tenderness. There is no guarding.  Musculoskeletal: Normal range of motion.  Neurological:     General: No focal deficit present.     Mental Status: She is alert and oriented to person, place, and time.     Cranial Nerves: No cranial nerve deficit.  Skin:    General: Skin is warm and dry.  Psychiatric:        Mood and Affect: Mood normal.        Behavior: Behavior normal.        Thought Content: Thought content normal.        Judgment: Judgment normal.  Vitals signs reviewed.     Assessment/Plan: Encounter for annual routine gynecological examination  Cervical cancer screening - Plan: Cytology - PAP  Screening for HPV (human papillomavirus) - Plan: Cytology - PAP  Irregular menses - Check thyroid. Will f/u with results. If neg, reassurance. Starting POPs anyway.  - Plan: TSH + free T4  Encounter for initial prescription of contraceptive pills - Prog only options discussed. Rx slynd eRxd, 1 sample/coupon card. F/u prn. Condoms for 1 mo. - Plan: Drospirenone (SLYND) 4 MG TABS  Family history of breast cancer - MyRisk testing discussed and pt to consider. Will  also see if mom will do update testing. - Plan: MM 3D SCREEN BREAST BILATERAL  Screening for breast cancer - Pt to sched mammo due to age of dx of mom's breast cancer.  - Plan: MM 3D SCREEN BREAST BILATERAL  Meds ordered this encounter  Medications  . Drospirenone (SLYND) 4 MG TABS    Sig: Take 1 tablet by mouth daily.    Dispense:  84 tablet    Refill:  3    Order Specific Question:   Supervising Provider    Answer:   Gae Dry [[id number]]             GYN counsel breast self exam, mammography screening, family planning options, adequate intake of calcium and vitamin D, diet and exercise     F/U  Return in about 1 year (around 08/23/2019).  Catelin Manthe B. Pilar Westergaard, PA-C 08/23/2018 5:12 PM

## 2018-08-23 NOTE — Patient Instructions (Signed)
I value your feedback and entrusting us with your care. If you get a [facility] patient survey, I would appreciate you taking the time to let us know about your experience today. Thank you! 

## 2018-08-24 LAB — TSH+FREE T4
Free T4: 1.2 ng/dL (ref 0.82–1.77)
TSH: 2.01 u[IU]/mL (ref 0.450–4.500)

## 2018-08-24 NOTE — Progress Notes (Signed)
Pt aware.

## 2018-08-24 NOTE — Progress Notes (Signed)
Pls let pt know thyroid normal. Start pills normally. F/u prn.

## 2018-08-25 LAB — CYTOLOGY - PAP
Diagnosis: NEGATIVE
HPV: NOT DETECTED

## 2019-02-09 IMAGING — US US ABDOMEN LIMITED
1 series · 14 of 25 positions shown · non-contrast
Comparison: None.

CLINICAL DATA: Elevated bilirubin

EXAM:
ULTRASOUND ABDOMEN LIMITED RIGHT UPPER QUADRANT

[Series 1: us abdomen limited · 0.19mm/px · 14 of 51 slices shown]
[im 1/51]
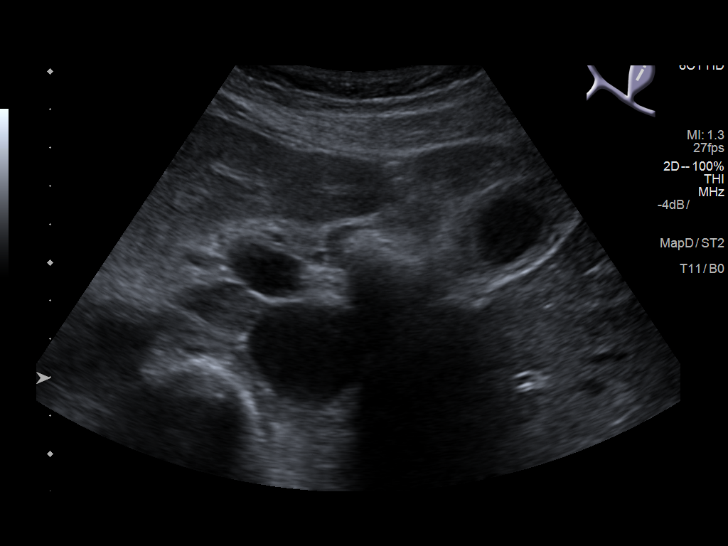
[im 5/51]
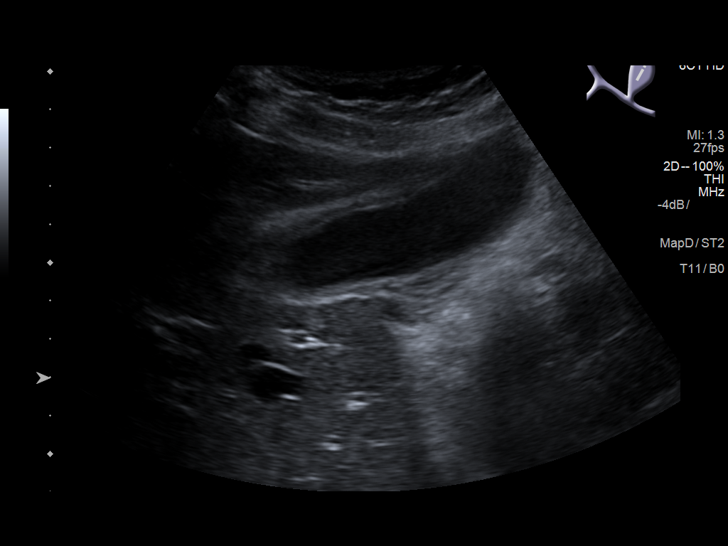
[im 9/51]
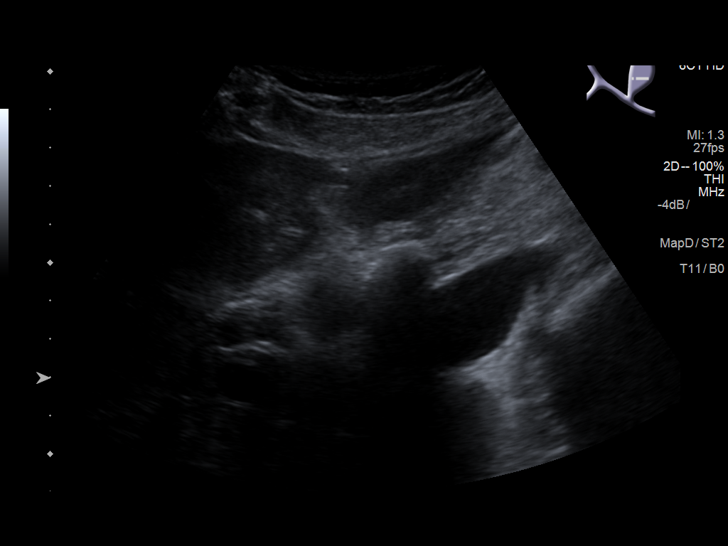
[im 13/51]
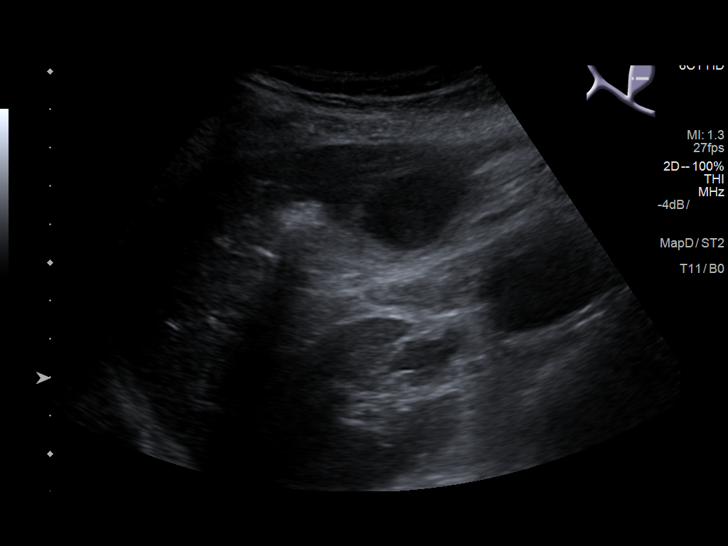
[im 17/51]
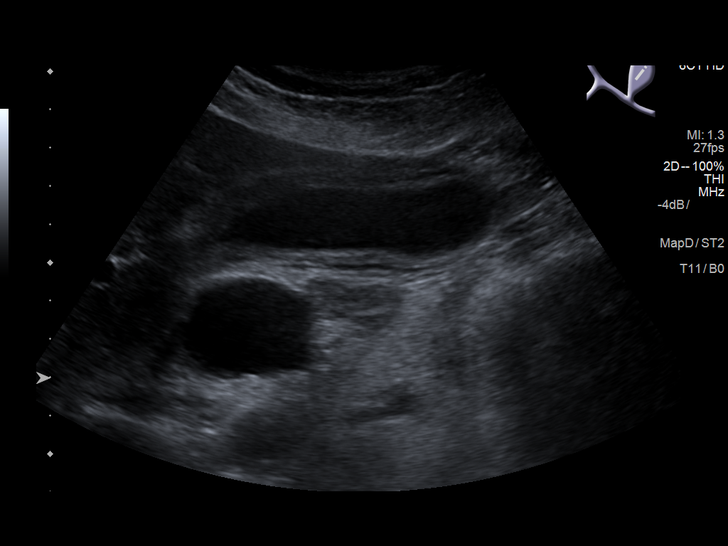
[im 19/51]
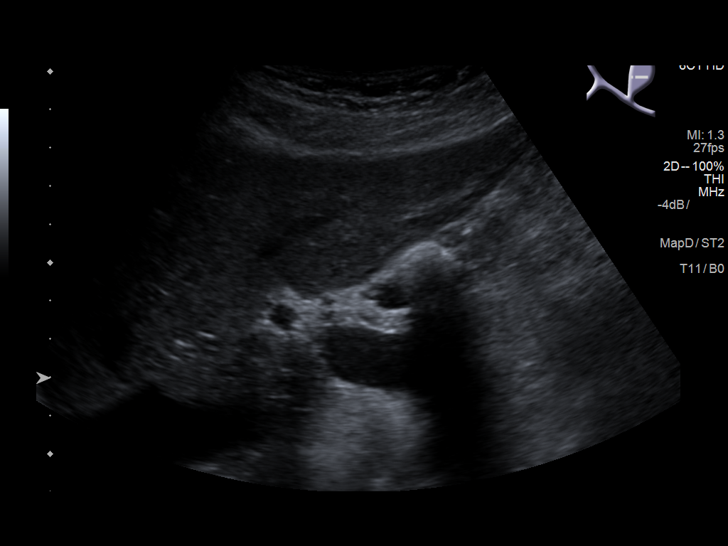
[im 23/51]
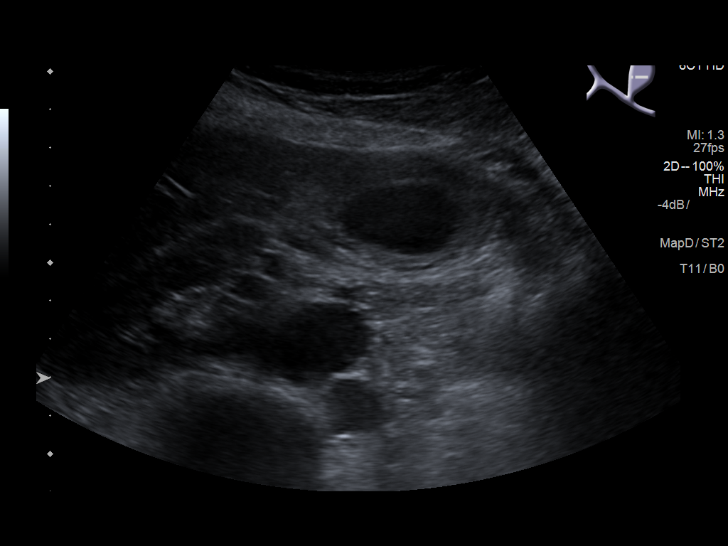
[im 28/51]
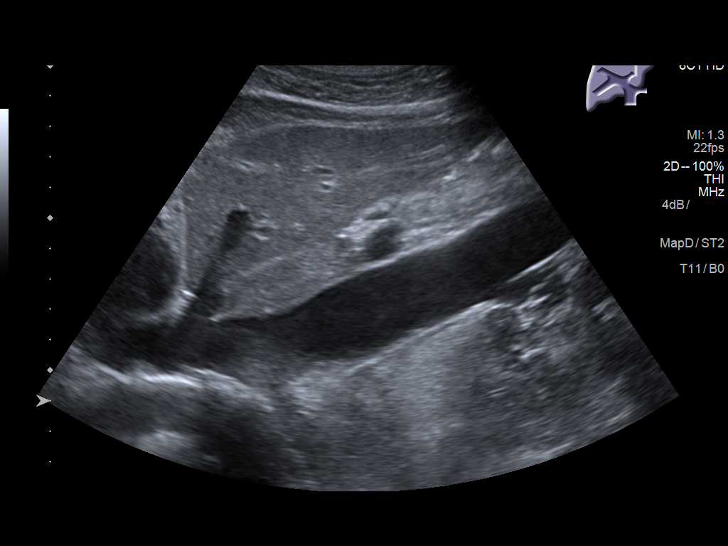
[im 32/51]
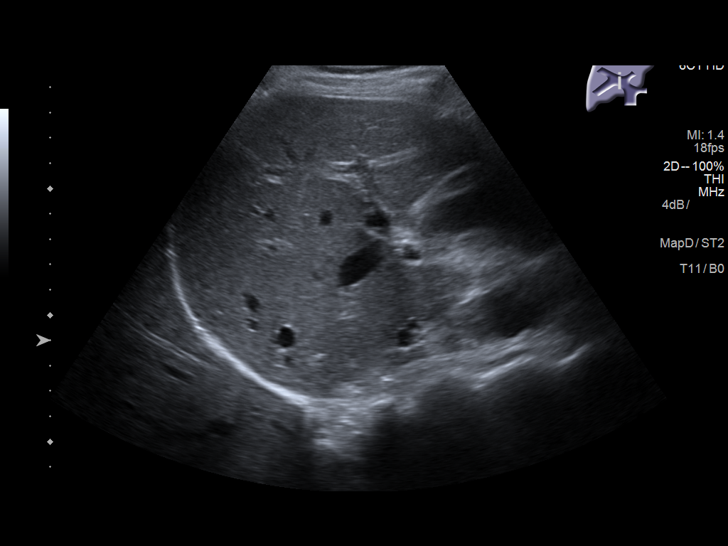
[im 34/51]
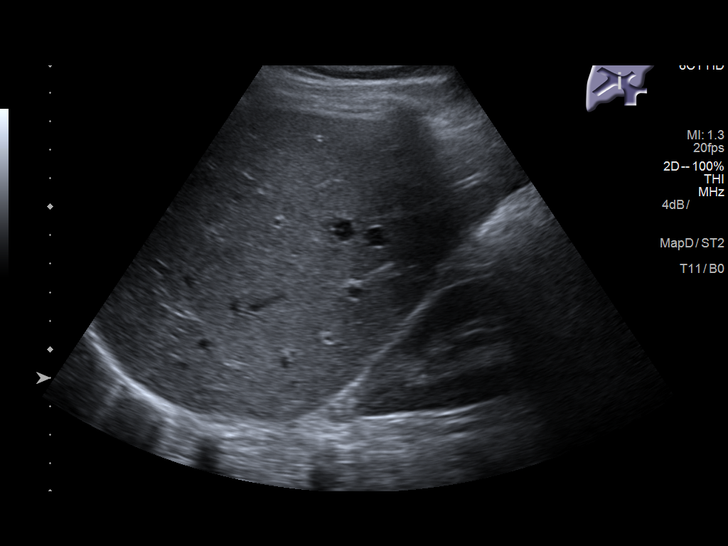
[im 38/51]
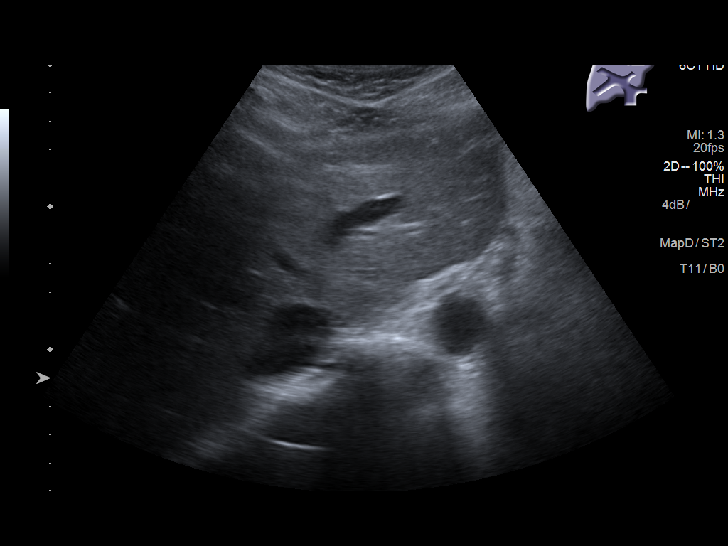
[im 42/51]
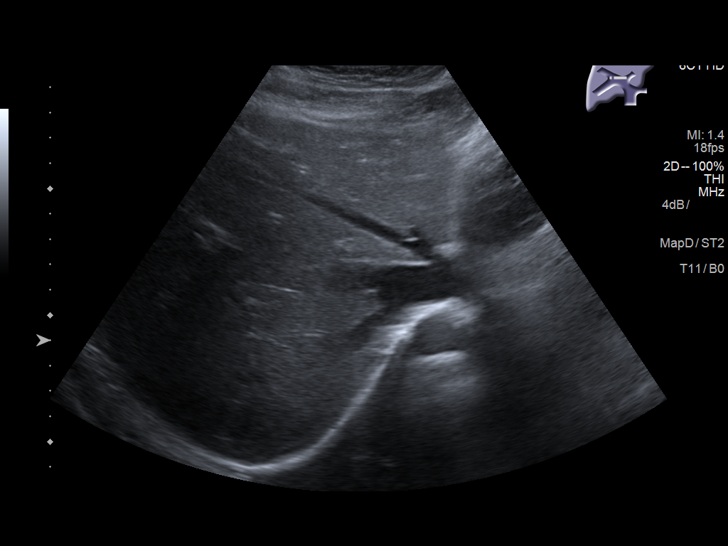
[im 46/51]
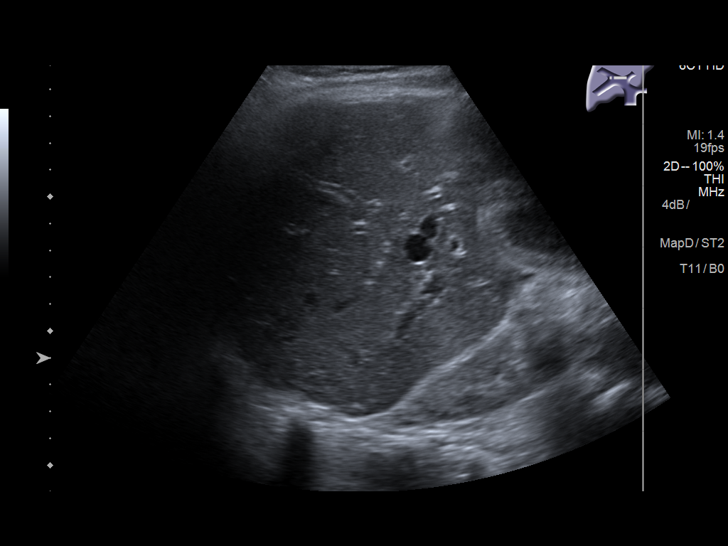
[im 51/51]
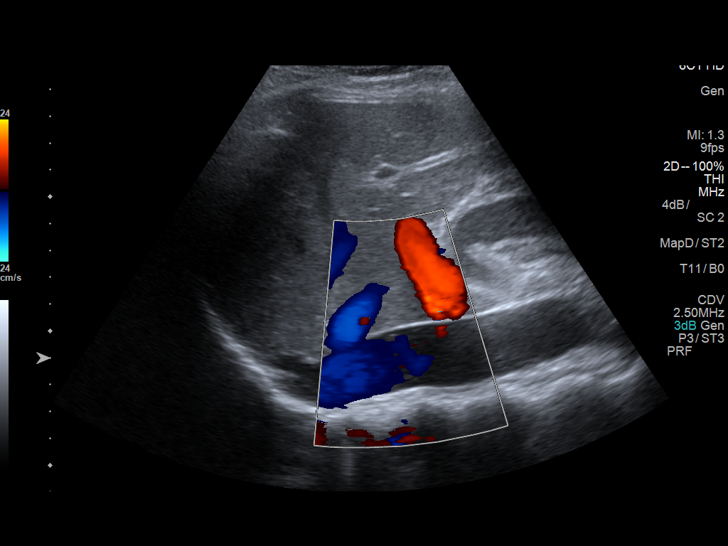

[14 of 25 positions shown; findings below may reference images not displayed]

FINDINGS: Gallbladder:

There is large area of shadowing at the neck of the gallbladder.
Reportedly the gallbladder has a fold such that this stone is seen
with gallbladder lumen adjacent to it on some images. The
gallbladder wall is thickened to 9 mm and focally tender.

Common bile duct:

Diameter: 4 mm.  Where visualized, no filling defect.

Liver:

On a few images there is question of intrahepatic biliary
dilatation. Mirizzi syndrome is considered. No evidence of mass
lesion. Portal vein is patent on color Doppler imaging with normal
direction of blood flow towards the liver.
IMPRESSION: 1. Findings of acute cholecystitis. There is a stone lodged at the
gallbladder neck with positive Murphy sign and diffuse gallbladder
wall thickening.
2. Question mild intrahepatic biliary duct dilatation with normal
common bile duct diameter.Mirizzi syndrome is a consideration.

## 2019-02-25 IMAGING — XA DG CHOLANGIOGRAM OPERATIVE
1 series · 4 of 4 positions shown · non-contrast
Comparison: Right upper quadrant abdominal ultrasound earlier
today.

CLINICAL DATA: Cholecystectomy for cholelithiasis and acute
cholecystitis.

EXAM:
INTRAOPERATIVE CHOLANGIOGRAM
TECHNIQUE: Cholangiographic images from the C-arm fluoroscopic device were
submitted for interpretation post-operatively. Please see the
procedural report for the amount of contrast and the fluoroscopy
time utilized.

[Series 6: cont. · 4 of 45 frames shown]
[frame 7/45]
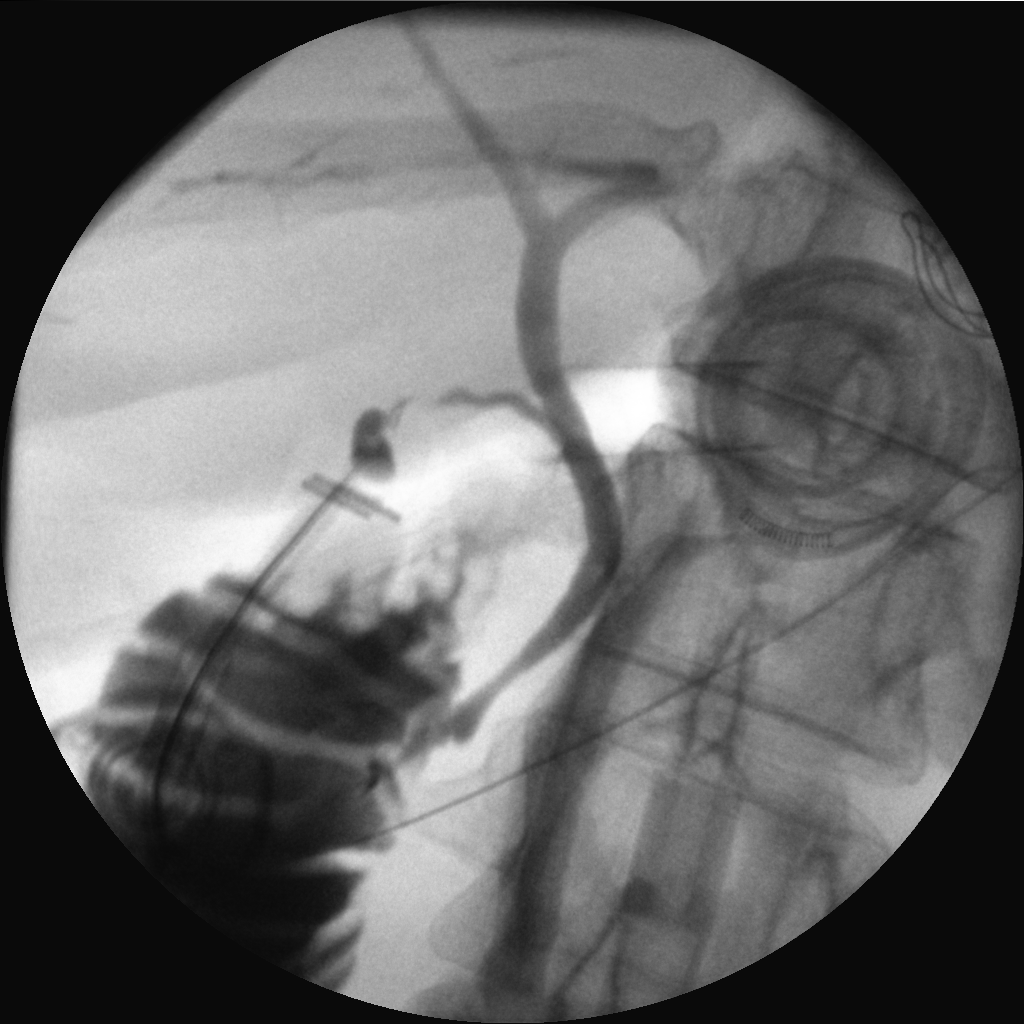
[frame 20/45]
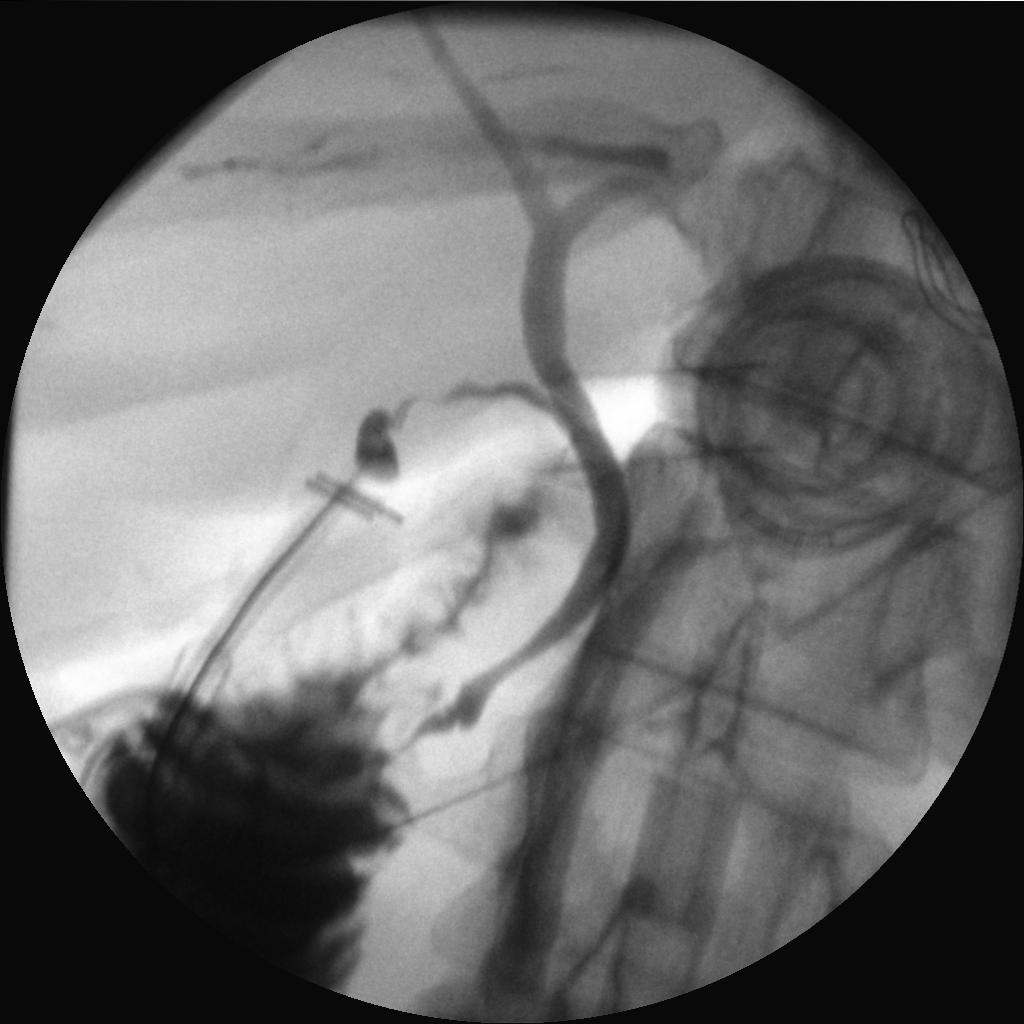
[frame 23/45]
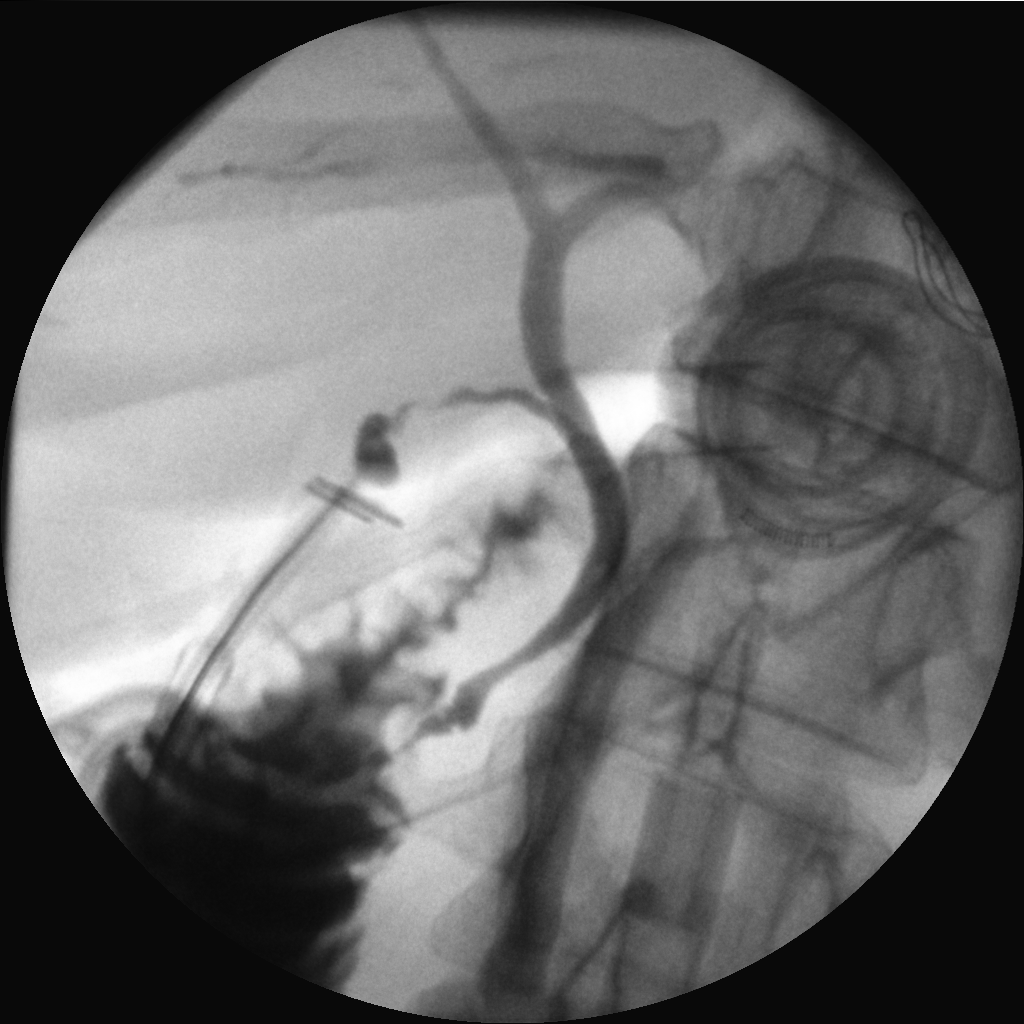
[frame 39/45]
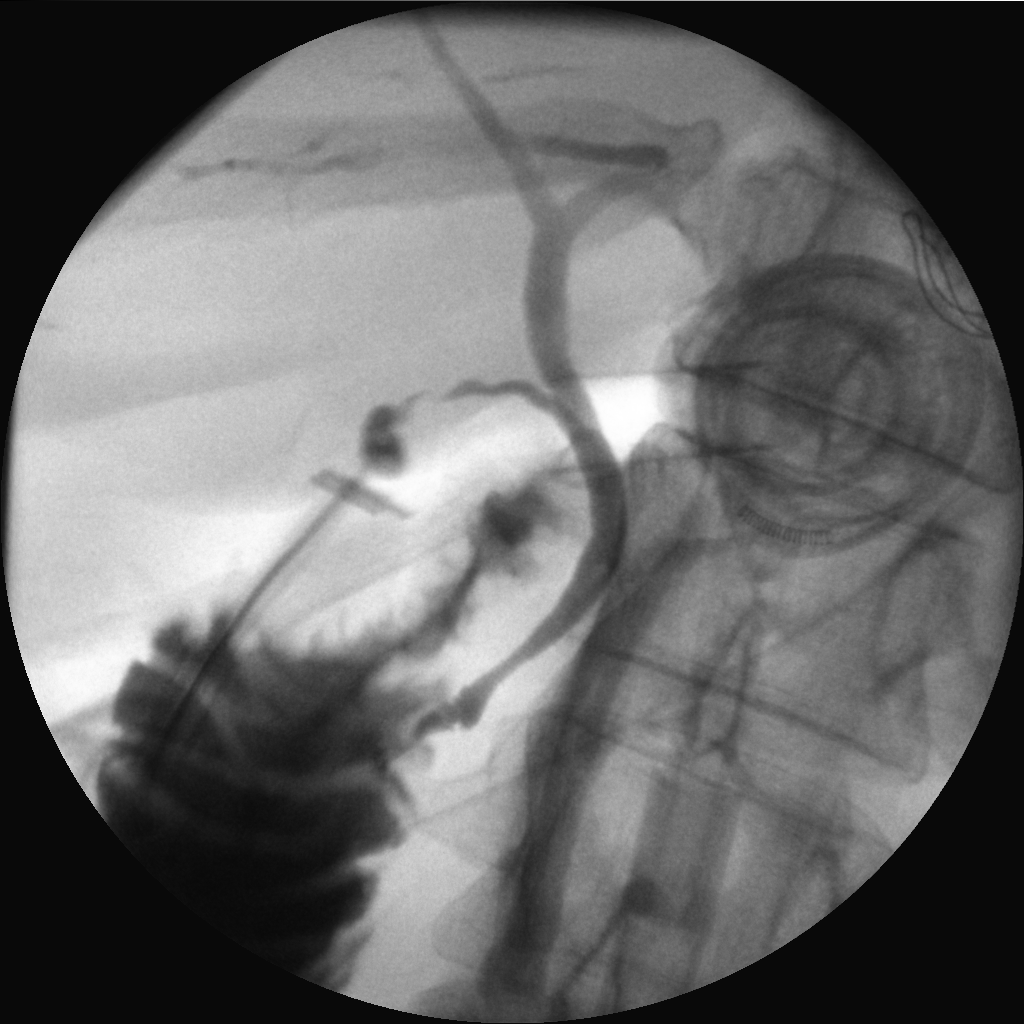

[4 of 4 positions shown; findings below may reference images not displayed]

FINDINGS: Intraoperative imaging obtained with a C-arm demonstrates normal
opacified biliary tree without evidence of filling defect. Contrast
enters the duodenum. No contrast extravasation identified.
IMPRESSION: Normal intraoperative cholangiogram.

## 2019-02-25 IMAGING — CR DG CHEST 2V
2 series · 2 of 2 positions shown · non-contrast
Comparison: None.

CLINICAL DATA: Acute onset of generalized chest pain.

EXAM:
CHEST  2 VIEW

[chest pa]
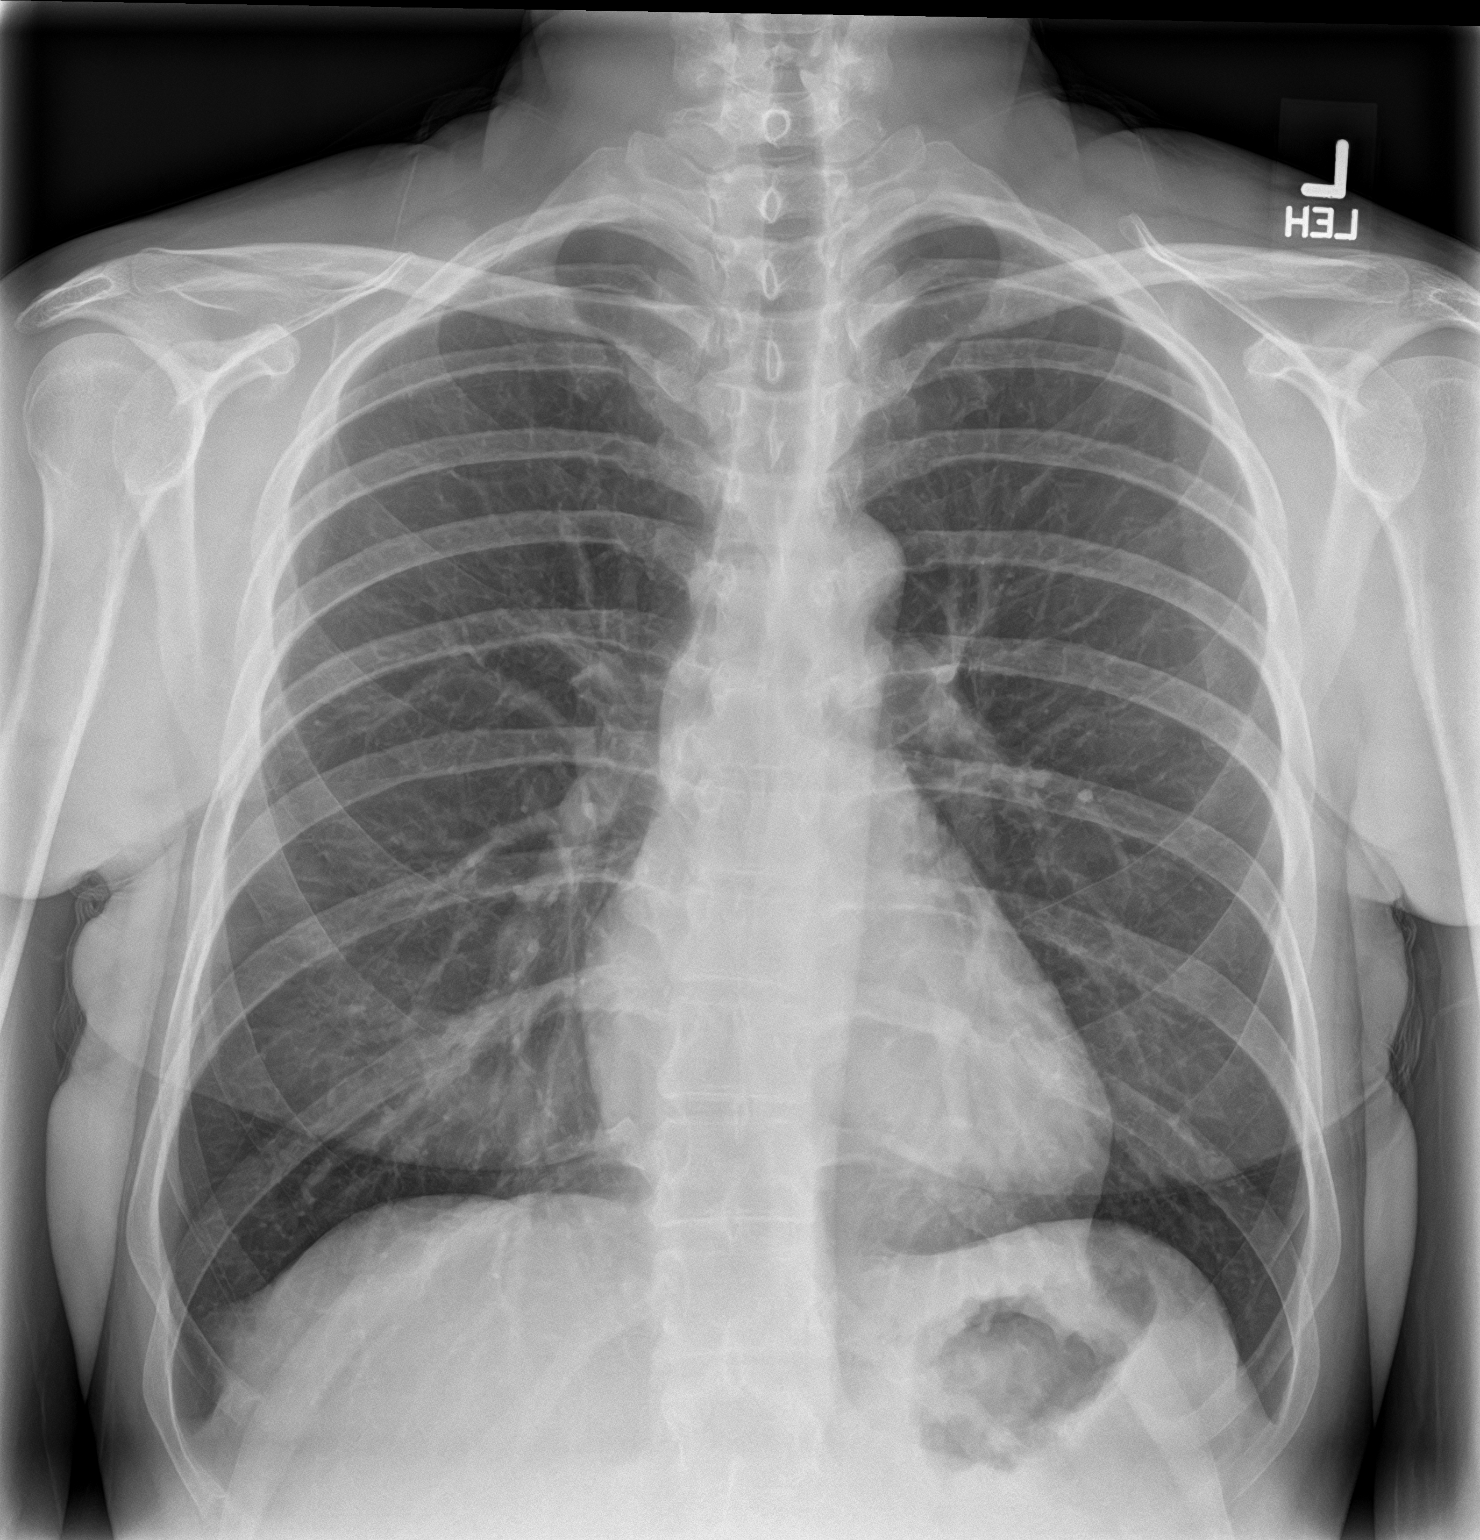

[chest lat]
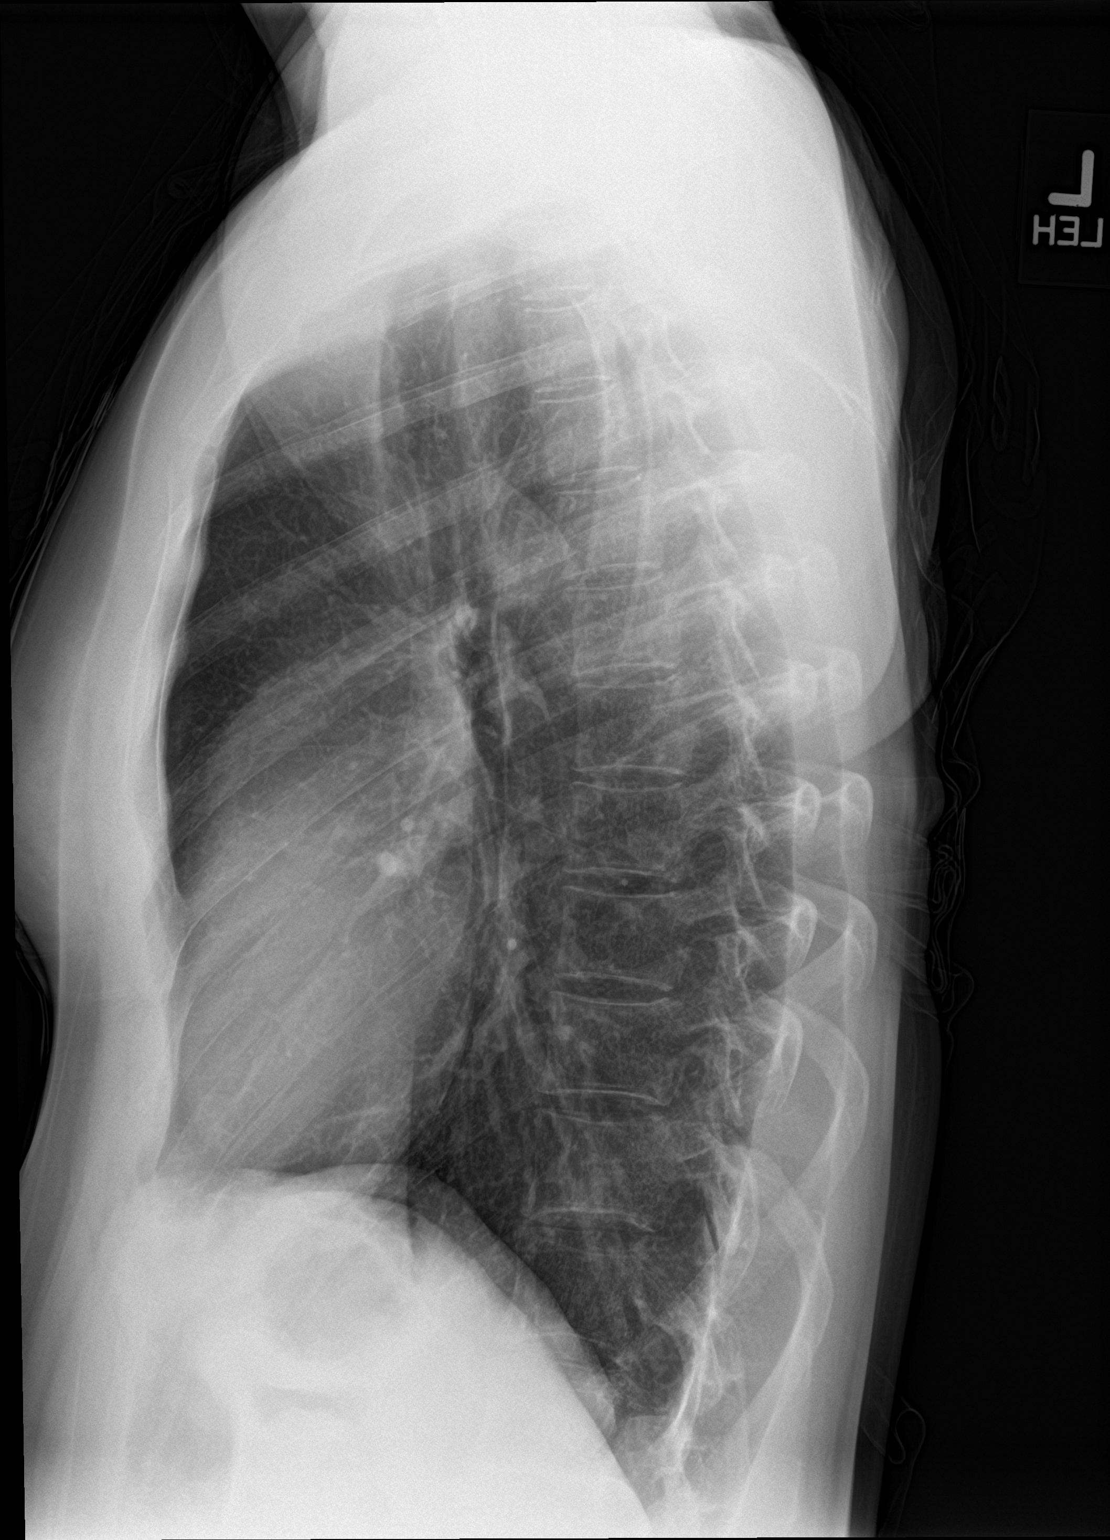

[2 of 2 positions shown; findings below may reference images not displayed]

FINDINGS: The lungs are well-aerated and clear. There is no evidence of focal
opacification, pleural effusion or pneumothorax.

The heart is normal in size; the mediastinal contour is within
normal limits. No acute osseous abnormalities are seen.
IMPRESSION: No acute cardiopulmonary process seen.

## 2021-03-24 NOTE — Progress Notes (Signed)
PCP:  Patient, No Pcp Per (Inactive)   Chief Complaint  Patient presents with   Gynecologic Exam    No concerns     HPI:      Ms. Amber Donaldson is a 40 y.o. [id number] who LMP was Patient's last menstrual period was 02/23/2021 (exact date)., presents today for her annual examination.  Her menses are usually regular every 28-30 days, lasting 5 days.  Dysmenorrhea mild, occurring first 1-2 days of flow, improved with NSAIDs. She does not have intermenstrual bleeding.   Sex activity: single partner, contraception - condoms; Would like to restart POPs. Did 2 yrs ago and did well. Hx of tob use.   Last Pap: 08/23/18 Results were normal/neg HPV DNA Hx of STDs: chlamydia, HPV--s/p LEEP age 73, neg paps since  Last mammogram: never; ordered before Covid and then not done There is a FH of breast cancer in her mom. Her mother was BRCA neg (? Update panel done). There is no FH of ovarian cancer. The patient does  self-breast exams.  Tobacco use: The patient currently smokes 1/2 packs of cigarettes per day for the past many years. Alcohol use: several times wkly No drug use.  Exercise: min active  She does get adequate calcium but not Vitamin D in her diet.   Past Medical History:  Diagnosis Date   Cervical dysplasia    Family history of breast cancer    History of chicken pox    History of migraines    1/month, excedrin controls   Smoker     Past Surgical History:  Procedure Laterality Date   CHOLECYSTECTOMY N/A 07/19/2017   Procedure: LAPAROSCOPIC CHOLECYSTECTOMY WITH INTRAOPERATIVE CHOLANGIOGRAM;  Surgeon: Vickie Epley, MD;  Location: [facility] ORS;  Service: General;  Laterality: N/A;   INDUCED ABORTION  2001   LEEP  2001    Family History  Problem Relation Age of Onset   Breast cancer Mother 34       BRCA neg   Heart disease Paternal Uncle    Heart disease Paternal Grandfather    Diabetes Cousin    Stroke Neg Hx    Ovarian cancer Neg Hx     Social History    Socioeconomic History   Marital status: Married    Spouse name: Not on file   Number of children: Not on file   Years of education: Not on file   Highest education level: Not on file  Occupational History   Not on file  Tobacco Use   Smoking status: Every Day    Packs/day: 0.30    Types: Cigarettes   Smokeless tobacco: Never  Vaping Use   Vaping Use: Never used  Substance and Sexual Activity   Alcohol use: Yes   Drug use: No   Sexual activity: Yes    Birth control/protection: Condom  Other Topics Concern   Not on file  Social History Narrative   Caffeine: 1-2 sodas/day   Lives with husband, 1 daughter (2010), no pets   Occupation: stay at home mom   Edu: Some college   Activity: home exercise regimen 15 min/day   Diet: some fruits/vegetables, good water, red meat QOweek, rare fish   Social Determinants of Radio broadcast assistant Strain: Not on file  Food Insecurity: Not on file  Transportation Needs: Not on file  Physical Activity: Not on file  Stress: Not on file  Social Connections: Not on file  Intimate Partner Violence: Not on file   Outpatient Medications  Prior to Visit  Medication Sig Dispense Refill   ibuprofen (ADVIL,MOTRIN) 600 MG tablet Take 1 tablet (600 mg total) by mouth every 8 (eight) hours as needed for mild pain or moderate pain. (Patient not taking: No sig reported) 30 tablet 0   Drospirenone (SLYND) 4 MG TABS Take 1 tablet by mouth daily. (Patient not taking: Reported on 03/25/2021) 84 tablet 3   No facility-administered medications prior to visit.     ROS:  Review of Systems  Constitutional:  Negative for fatigue, fever and unexpected weight change.  Respiratory:  Negative for cough, shortness of breath and wheezing.   Cardiovascular:  Negative for chest pain, palpitations and leg swelling.  Gastrointestinal:  Negative for blood in stool, constipation, diarrhea, nausea and vomiting.  Endocrine: Negative for cold intolerance, heat  intolerance and polyuria.  Genitourinary:  Negative for dyspareunia, dysuria, flank pain, frequency, genital sores, hematuria, menstrual problem, pelvic pain, urgency, vaginal bleeding, vaginal discharge and vaginal pain.  Musculoskeletal:  Negative for back pain, joint swelling and myalgias.  Skin:  Negative for rash.  Neurological:  Negative for dizziness, syncope, light-headedness, numbness and headaches.  Hematological:  Negative for adenopathy.  Psychiatric/Behavioral:  Negative for agitation, confusion, sleep disturbance and suicidal ideas. The patient is not nervous/anxious.   BREAST: No symptoms   Objective: BP 110/80   Ht 5' 5"  (1.651 m)   Wt 156 lb (70.8 kg)   LMP 02/23/2021 (Exact Date)   BMI 25.96 kg/m    Physical Exam Constitutional:      Appearance: She is well-developed.  Genitourinary:     Vulva normal.     No vaginal discharge, erythema or tenderness.      Right Adnexa: not tender and no mass present.    Left Adnexa: not tender and no mass present.    Cervical nabothian cyst present.     No cervical polyp.     Uterus is not enlarged or tender.  Breasts:    Right: No mass, nipple discharge, skin change or tenderness.     Left: No mass, nipple discharge, skin change or tenderness.  Neck:     Thyroid: No thyromegaly.  Cardiovascular:     Rate and Rhythm: Normal rate and regular rhythm.     Heart sounds: Normal heart sounds. No murmur heard. Pulmonary:     Effort: Pulmonary effort is normal.     Breath sounds: Normal breath sounds.  Abdominal:     Palpations: Abdomen is soft.     Tenderness: There is no abdominal tenderness. There is no guarding.  Musculoskeletal:        General: Normal range of motion.     Cervical back: Normal range of motion.  Neurological:     General: No focal deficit present.     Mental Status: She is alert and oriented to person, place, and time.     Cranial Nerves: No cranial nerve deficit.  Skin:    General: Skin is warm and  dry.  Psychiatric:        Mood and Affect: Mood normal.        Behavior: Behavior normal.        Thought Content: Thought content normal.        Judgment: Judgment normal.  Vitals reviewed.    Assessment/Plan: Encounter for annual routine gynecological examination  Encounter for screening mammogram for malignant neoplasm of breast - Plan: MM 3D SCREEN BREAST BILATERAL; pt to sched mammo  Family history of breast cancer - Plan: MM  3D SCREEN BREAST BILATERAL; MyRisk testing discussed. Pt to consider and f/u prn.   Encounter for initial prescription of contraceptive pills -  Rx slynd eRxd, start with menses, Condoms for 1 mo. - Plan: Drospirenone (SLYND) 4 MG TABS   Meds ordered this encounter  Medications   Drospirenone (SLYND) 4 MG TABS    Sig: Take 1 tablet by mouth daily.    Dispense:  84 tablet    Refill:  3    Order Specific Question:   Supervising Provider    Answer:   Gae Dry [[id number]]              GYN counsel breast self exam, mammography screening, adequate intake of calcium and vitamin D, diet and exercise     F/U  Return in about 1 year (around 03/25/2022).  Benjaman Artman B. Jayin Derousse, PA-C 03/25/2021 4:37 PM

## 2021-03-25 ENCOUNTER — Other Ambulatory Visit: Payer: Self-pay

## 2021-03-25 ENCOUNTER — Encounter: Payer: Self-pay | Admitting: Obstetrics and Gynecology

## 2021-03-25 ENCOUNTER — Ambulatory Visit (INDEPENDENT_AMBULATORY_CARE_PROVIDER_SITE_OTHER): Payer: 59 | Admitting: Obstetrics and Gynecology

## 2021-03-25 VITALS — BP 110/80 | Ht 65.0 in | Wt 156.0 lb

## 2021-03-25 DIAGNOSIS — Z803 Family history of malignant neoplasm of breast: Secondary | ICD-10-CM | POA: Diagnosis not present

## 2021-03-25 DIAGNOSIS — Z3041 Encounter for surveillance of contraceptive pills: Secondary | ICD-10-CM

## 2021-03-25 DIAGNOSIS — Z1231 Encounter for screening mammogram for malignant neoplasm of breast: Secondary | ICD-10-CM

## 2021-03-25 DIAGNOSIS — Z30011 Encounter for initial prescription of contraceptive pills: Secondary | ICD-10-CM | POA: Diagnosis not present

## 2021-03-25 DIAGNOSIS — Z01419 Encounter for gynecological examination (general) (routine) without abnormal findings: Secondary | ICD-10-CM

## 2021-03-25 MED ORDER — SLYND 4 MG PO TABS: 1.0000 | ORAL_TABLET | Freq: Every day | ORAL | 3 refills | Status: AC

## 2021-03-25 NOTE — Patient Instructions (Signed)
I value your feedback and you entrusting us with your care. If you get a [facility] patient survey, I would appreciate you taking the time to let us know about your experience today. Thank you!  [facility]: [phone]  [facility]: [phone]    

## 2021-04-02 ENCOUNTER — Encounter: Payer: Self-pay | Admitting: Obstetrics and Gynecology

## 2021-04-03 ENCOUNTER — Encounter: Payer: Self-pay | Admitting: Obstetrics and Gynecology

## 2023-12-25 ENCOUNTER — Ambulatory Visit
Admission: RE | Admit: 2023-12-25 | Discharge: 2023-12-25 | Disposition: A | Discharge status: Home / Self Care | Source: Ambulatory Visit | Attending: Emergency Medicine | Admitting: Emergency Medicine

## 2023-12-25 VITALS — BP 133/95 | HR 88 | Temp 98.2°F | Resp 18

## 2023-12-25 DIAGNOSIS — J069 Acute upper respiratory infection, unspecified: Secondary | ICD-10-CM | POA: Diagnosis not present

## 2023-12-25 MED ORDER — AMOXICILLIN-POT CLAVULANATE 875-125 MG PO TABS: 1.0000 | ORAL_TABLET | Freq: Two times a day (BID) | ORAL | 0 refills | Status: AC

## 2023-12-25 MED ORDER — BENZONATATE 100 MG PO CAPS: 100.0000 mg | ORAL_CAPSULE | Freq: Three times a day (TID) | ORAL | 0 refills | Status: AC

## 2023-12-25 MED ORDER — GUAIFENESIN-CODEINE 100-10 MG/5ML PO SOLN: 5.0000 mL | Freq: Every evening | ORAL | 0 refills | Status: AC | PRN

## 2023-12-25 NOTE — Discharge Instructions (Signed)
 Augmentin twice daily for 7 days to clear any bacteria contributing to symptoms  You may use Tessalon pill every 8 hours as needed for cough, may use that cough syrup at bedtime to rest    You can take Tylenol  and/or Ibuprofen  as needed for fever reduction and pain relief.   For cough: honey 1/2 to 1 teaspoon (you can dilute the honey in water or another fluid).  You can also use guaifenesin and dextromethorphan for cough. You can use a humidifier for chest congestion and cough.  If you don't have a humidifier, you can sit in the bathroom with the hot shower running.      For sore throat: try warm salt water gargles, cepacol lozenges, throat spray, warm tea or water with lemon/honey, popsicles or ice, or OTC cold relief medicine for throat discomfort.   For congestion: take a daily anti-histamine like Zyrtec, Claritin, and a oral decongestant, such as pseudoephedrine.  You can also use Flonase 1-2 sprays in each nostril daily.   It is important to stay hydrated: drink plenty of fluids (water, gatorade/powerade/pedialyte, juices, or teas) to keep your throat moisturized and help further relieve irritation/discomfort.

## 2023-12-25 NOTE — ED Provider Notes (Addendum)
 Amber Donaldson    CSN: [id number] Arrival date & time: 12/25/23  1801      History   Chief Complaint Chief Complaint  Patient presents with   Cough    I've had a cough for about 2 weeks that has now started to cause pain when coughing - Entered by patient    HPI Amber Donaldson is a 43 y.o. female.   Patient presents for evaluation of nasal congestion, rhinorrhea, productive cough, sore throat and intermittent headaches present for 2 weeks.  Over the last 4 days has begun to experience left-sided side pain exacerbated by coughing and difficulty taking a deep breath only in the mornings.  Denies shortness of breath or wheezing.  Denies fever.  Decreased appetite but able to tolerate some food and liquids.  Has attempted use of Mucinex DM daytime and nighttime.  Known sick contact at work prior to symptoms beginning.  Daily tobacco use.  Past Medical History:  Diagnosis Date   Cervical dysplasia    Family history of breast cancer    History of chicken pox    History of migraines    1/month, excedrin controls   Smoker     Patient Active Problem List   Diagnosis Date Noted   Acute cholecystitis due to biliary calculus 07/19/2017    Past Surgical History:  Procedure Laterality Date   CHOLECYSTECTOMY N/A 07/19/2017   Procedure: LAPAROSCOPIC CHOLECYSTECTOMY WITH INTRAOPERATIVE CHOLANGIOGRAM;  Surgeon: Nicholaus Selinda Birmingham, MD;  Location: [facility] ORS;  Service: General;  Laterality: N/A;   INDUCED ABORTION  2001   LEEP  2001    OB History     Gravida  3   Para  2   Term  2   Preterm      AB  1   Living  2      SAB  1   IAB      Ectopic      Multiple  0   Live Births  2            Home Medications    Prior to Admission medications   Medication Sig Start Date End Date Taking? Authorizing Provider  amoxicillin-clavulanate (AUGMENTIN) 875-125 MG tablet Take 1 tablet by mouth every 12 (twelve) hours. 12/25/23  Yes Anu Stagner R, NP  benzonatate  (TESSALON) 100 MG capsule Take 1 capsule (100 mg total) by mouth every 8 (eight) hours. 12/25/23  Yes Autym Siess R, NP  guaiFENesin-codeine 100-10 MG/5ML syrup Take 5 mLs by mouth at bedtime as needed for cough. 12/25/23  Yes Sebron Mcmahill R, NP  Drospirenone  (SLYND ) 4 MG TABS Take 1 tablet by mouth daily. 03/25/21   Copland, Alicia B, PA-C  ibuprofen  (ADVIL ,MOTRIN ) 600 MG tablet Take 1 tablet (600 mg total) by mouth every 8 (eight) hours as needed for mild pain or moderate pain. Patient not taking: No sig reported 07/20/17   Desiderio Schanz, MD    Family History Family History  Problem Relation Age of Onset   Breast cancer Mother 66       BRCA neg   Heart disease Paternal Uncle    Heart disease Paternal Grandfather    Diabetes Cousin    Stroke Neg Hx    Ovarian cancer Neg Hx     Social History Social History   Tobacco Use   Smoking status: Every Day    Current packs/day: 0.30    Types: Cigarettes   Smokeless tobacco: Never  Vaping Use   Vaping  status: Never Used  Substance Use Topics   Alcohol use: Yes   Drug use: No     Allergies   Patient has no known allergies.   Review of Systems Review of Systems  Respiratory:  Positive for cough.      Physical Exam Triage Vital Signs ED Triage Vitals [12/25/23 1841]  Encounter Vitals Group     BP (!) 133/95     Girls Systolic BP Percentile      Girls Diastolic BP Percentile      Boys Systolic BP Percentile      Boys Diastolic BP Percentile      Pulse Rate 88     Resp 18     Temp 98.2 F (36.8 C)     Temp Source Oral     SpO2 98 %     Weight      Height      Head Circumference      Peak Flow      Pain Score      Pain Loc      Pain Education      Exclude from Growth Chart    No data found.  Updated Vital Signs BP (!) 133/95 (BP Location: Left Arm)   Pulse 88   Temp 98.2 F (36.8 C) (Oral)   Resp 18   SpO2 98%   Visual Acuity Right Eye Distance:   Left Eye Distance:   Bilateral Distance:     Right Eye Near:   Left Eye Near:    Bilateral Near:     Physical Exam Constitutional:      Appearance: Normal appearance.  HENT:     Head: Normocephalic.     Right Ear: Tympanic membrane, ear canal and external ear normal.     Left Ear: Tympanic membrane, ear canal and external ear normal.     Nose: Congestion present.     Mouth/Throat:     Mouth: Mucous membranes are moist.     Pharynx: Oropharynx is clear. No oropharyngeal exudate or posterior oropharyngeal erythema.  Eyes:     Extraocular Movements: Extraocular movements intact.  Cardiovascular:     Rate and Rhythm: Normal rate and regular rhythm.     Pulses: Normal pulses.     Heart sounds: Normal heart sounds.  Pulmonary:     Effort: Pulmonary effort is normal.     Breath sounds: Normal breath sounds.  Chest:       Comments: Tenderness approximately over ribs 4 and 5, no ecchymosis swelling or deformity, chest wall symmetrical Musculoskeletal:     Cervical back: Normal range of motion and neck supple.  Neurological:     Mental Status: She is alert and oriented to person, place, and time. Mental status is at baseline.      UC Treatments / Results  Labs (all labs ordered are listed, but only abnormal results are displayed) Labs Reviewed - No data to display  EKG   Radiology No results found.  Procedures Procedures (including critical care time)  Medications Ordered in UC Medications - No data to display  Initial Impression / Assessment and Plan / UC Course  I have reviewed the triage vital signs and the nursing notes.  Pertinent labs & imaging results that were available during my care of the patient were reviewed by me and considered in my medical decision making (see chart for details).  Acute URI  Patient is in no signs of distress nor toxic appearing.  Vital signs are  stable.  Low suspicion for pneumonia, pneumothorax or bronchitis and therefore will defer imaging.  symptoms persisting for 2  weeks we will provide bacterial coverage.  Prescribed Augmentin, Tessalon and guaifenesin codeine, PDMP reviewed, low risk.May use additional over-the-counter medications as needed for supportive care.  May follow-up with urgent care as needed if symptoms persist or worsen.    Final Clinical Impressions(s) / UC Diagnoses   Final diagnoses:  Acute URI     Discharge Instructions      Augmentin twice daily for 7 days to clear any bacteria contributing to symptoms  You may use Tessalon pill every 8 hours as needed for cough, may use that cough syrup at bedtime to rest    You can take Tylenol  and/or Ibuprofen  as needed for fever reduction and pain relief.   For cough: honey 1/2 to 1 teaspoon (you can dilute the honey in water or another fluid).  You can also use guaifenesin and dextromethorphan for cough. You can use a humidifier for chest congestion and cough.  If you don't have a humidifier, you can sit in the bathroom with the hot shower running.      For sore throat: try warm salt water gargles, cepacol lozenges, throat spray, warm tea or water with lemon/honey, popsicles or ice, or OTC cold relief medicine for throat discomfort.   For congestion: take a daily anti-histamine like Zyrtec, Claritin, and a oral decongestant, such as pseudoephedrine.  You can also use Flonase 1-2 sprays in each nostril daily.   It is important to stay hydrated: drink plenty of fluids (water, gatorade/powerade/pedialyte, juices, or teas) to keep your throat moisturized and help further relieve irritation/discomfort.    ED Prescriptions     Medication Sig Dispense Auth. Provider   amoxicillin-clavulanate (AUGMENTIN) 875-125 MG tablet Take 1 tablet by mouth every 12 (twelve) hours. 14 tablet Takumi Din R, NP   benzonatate (TESSALON) 100 MG capsule Take 1 capsule (100 mg total) by mouth every 8 (eight) hours. 21 capsule Lattie Riege R, NP   guaiFENesin-codeine 100-10 MG/5ML syrup Take 5 mLs by mouth  at bedtime as needed for cough. 120 mL Jaquan Sadowsky, Shelba SAUNDERS, NP      I have reviewed the PDMP during this encounter.   Teresa Shelba SAUNDERS, NP 12/25/23 1910    Teresa Shelba SAUNDERS, NP 12/25/23 1910
